# Patient Record
Sex: Male | Born: 1955 | Race: White | Hispanic: No | Marital: Married | Smoking: Former smoker
Health system: Southern US, Community
[De-identification: ages and names within clinical notes are randomized; demographics above are authoritative.]

## PROBLEM LIST (undated history)

## (undated) DIAGNOSIS — R7303 Prediabetes: Secondary | ICD-10-CM

## (undated) DIAGNOSIS — K529 Noninfective gastroenteritis and colitis, unspecified: Secondary | ICD-10-CM

## (undated) DIAGNOSIS — K219 Gastro-esophageal reflux disease without esophagitis: Secondary | ICD-10-CM

## (undated) DIAGNOSIS — T7840XA Allergy, unspecified, initial encounter: Secondary | ICD-10-CM

## (undated) DIAGNOSIS — E785 Hyperlipidemia, unspecified: Secondary | ICD-10-CM

## (undated) HISTORY — DX: Allergy, unspecified, initial encounter: T78.40XA

## (undated) HISTORY — DX: Prediabetes: R73.03

## (undated) HISTORY — DX: Noninfective gastroenteritis and colitis, unspecified: K52.9

## (undated) HISTORY — DX: Hyperlipidemia, unspecified: E78.5

## (undated) HISTORY — DX: Gastro-esophageal reflux disease without esophagitis: K21.9

## (undated) HISTORY — PX: VASECTOMY: SHX75

---

## 2012-12-04 ENCOUNTER — Ambulatory Visit (INDEPENDENT_AMBULATORY_CARE_PROVIDER_SITE_OTHER): Payer: BC Managed Care – PPO | Admitting: Family Medicine

## 2012-12-04 ENCOUNTER — Encounter: Payer: Self-pay | Admitting: Family Medicine

## 2012-12-04 VITALS — BP 130/78 | HR 80 | Temp 98.0°F | Resp 20 | Wt 224.5 lb

## 2012-12-04 DIAGNOSIS — H60393 Other infective otitis externa, bilateral: Secondary | ICD-10-CM

## 2012-12-04 DIAGNOSIS — H60399 Other infective otitis externa, unspecified ear: Secondary | ICD-10-CM | POA: Insufficient documentation

## 2012-12-04 MED ORDER — CIPROFLOXACIN HCL 500 MG PO TABS: 500.0000 mg | ORAL_TABLET | Freq: Two times a day (BID) | ORAL | Status: DC

## 2012-12-04 MED ORDER — CIPROFLOXACIN-DEXAMETHASONE 0.3-0.1 % OT SUSP: 4.0000 [drp] | Freq: Two times a day (BID) | OTIC | Status: DC

## 2012-12-04 NOTE — Assessment & Plan Note (Signed)
Recurrent episodes of OE, he does wear ear plugs on regular basis He has been treated with oral and otic antibiotics which does resolve symptoms but has returned multiple times I did attempt wet prep from the drainage no fungus seen Will start him on Oral cipro, he is already on ciprodex drops but I dont think much is getting into the left ear. Referral placed to  ENT, he will be seen this week. I think he may need a wick placed

## 2012-12-04 NOTE — Patient Instructions (Signed)
Start the oral antibiotics as directed Continue drops as directed ENT appt on Friday

## 2012-12-04 NOTE — Progress Notes (Signed)
  Subjective:    Patient ID: Victor Chandler, male    DOB: 03-03-1955, 57 y.o.   MRN: [id number]  HPI  Patient here with recurrent ear pain and bilateral drainage for the past week. He is history of recurrent otitis externa in both ears. He was treated 3 times over the past year. He does wear earplugs. He has not been swimming or any other body of water. He denies any cough shortness of breath sinus drainage. He denies any fever. His hearing is muffled some  Review of Systems - per above GEN- denies fatigue, fever, weight loss,weakness, recent illness HEENT- denies eye drainage, change in vision, nasal discharge, CVS- denies chest pain, palpitations RESP- denies SOB, cough, wheeze Neuro- denies headache, dizziness, syncope, seizure activity        Objective:   Physical Exam GEN- NAD, alert and oriented x3 HEENT- PERRL, EOMI, non injected sclera, pink conjunctiva, MMM, oropharynx clear- Right Canal erythematous dry discharge, swelling of canal, decreased light reflex TM, Left TM unable to visualize, swelling thick white/yellow discharge in canal, erythema, Tenderness with manipulation, TM clear bilat no effusion, nares clear Lymph- Shotty cervical LAD, left side Pulses- Radial 2+         Assessment & Plan:

## 2013-02-10 ENCOUNTER — Other Ambulatory Visit: Payer: Self-pay | Admitting: Family Medicine

## 2013-02-10 MED ORDER — MOMETASONE FUROATE 50 MCG/ACT NA SUSP: 2.0000 | Freq: Every day | NASAL | Status: DC

## 2013-02-10 NOTE — Telephone Encounter (Signed)
Rx Refilled  

## 2013-04-26 ENCOUNTER — Other Ambulatory Visit: Payer: Self-pay | Admitting: Family Medicine

## 2013-05-27 ENCOUNTER — Other Ambulatory Visit: Payer: BC Managed Care – PPO

## 2013-05-27 ENCOUNTER — Encounter: Payer: Self-pay | Admitting: Family Medicine

## 2013-05-27 ENCOUNTER — Ambulatory Visit (INDEPENDENT_AMBULATORY_CARE_PROVIDER_SITE_OTHER): Payer: BC Managed Care – PPO | Admitting: Family Medicine

## 2013-05-27 VITALS — BP 142/78 | HR 78 | Temp 98.0°F | Resp 14 | Ht 69.5 in | Wt 226.0 lb

## 2013-05-27 DIAGNOSIS — E785 Hyperlipidemia, unspecified: Secondary | ICD-10-CM

## 2013-05-27 DIAGNOSIS — M5430 Sciatica, unspecified side: Secondary | ICD-10-CM

## 2013-05-27 DIAGNOSIS — Z0000 Encounter for general adult medical examination without abnormal findings: Secondary | ICD-10-CM

## 2013-05-27 DIAGNOSIS — M5386 Other specified dorsopathies, lumbar region: Secondary | ICD-10-CM | POA: Insufficient documentation

## 2013-05-27 LAB — CBC WITH DIFFERENTIAL/PLATELET
Basophils Absolute: 0.1 10*3/uL (ref 0.0–0.1)
Basophils Relative: 1 % (ref 0–1)
Eosinophils Absolute: 0.4 10*3/uL (ref 0.0–0.7)
Eosinophils Relative: 6 % — ABNORMAL HIGH (ref 0–5)
HEMATOCRIT: 44.1 % (ref 39.0–52.0)
Hemoglobin: 15.1 g/dL (ref 13.0–17.0)
LYMPHS PCT: 26 % (ref 12–46)
Lymphs Abs: 1.8 10*3/uL (ref 0.7–4.0)
MCH: 30.6 pg (ref 26.0–34.0)
MCHC: 34.2 g/dL (ref 30.0–36.0)
MCV: 89.5 fL (ref 78.0–100.0)
MONO ABS: 0.7 10*3/uL (ref 0.1–1.0)
MONOS PCT: 10 % (ref 3–12)
Neutro Abs: 4 10*3/uL (ref 1.7–7.7)
Neutrophils Relative %: 57 % (ref 43–77)
Platelets: 219 10*3/uL (ref 150–400)
RBC: 4.93 MIL/uL (ref 4.22–5.81)
RDW: 13.2 % (ref 11.5–15.5)
WBC: 7.1 10*3/uL (ref 4.0–10.5)

## 2013-05-27 LAB — COMPLETE METABOLIC PANEL WITH GFR
ALBUMIN: 4.2 g/dL (ref 3.5–5.2)
ALT: 26 U/L (ref 0–53)
AST: 32 U/L (ref 0–37)
Alkaline Phosphatase: 64 U/L (ref 39–117)
BUN: 16 mg/dL (ref 6–23)
CALCIUM: 8.9 mg/dL (ref 8.4–10.5)
CHLORIDE: 102 meq/L (ref 96–112)
CO2: 25 mEq/L (ref 19–32)
CREATININE: 1.16 mg/dL (ref 0.50–1.35)
GFR, Est African American: 81 mL/min
GFR, Est Non African American: 70 mL/min
Glucose, Bld: 98 mg/dL (ref 70–99)
POTASSIUM: 4.4 meq/L (ref 3.5–5.3)
Sodium: 139 mEq/L (ref 135–145)
TOTAL PROTEIN: 7.1 g/dL (ref 6.0–8.3)
Total Bilirubin: 0.5 mg/dL (ref 0.2–1.2)

## 2013-05-27 LAB — LIPID PANEL
Cholesterol: 168 mg/dL (ref 0–200)
HDL: 38 mg/dL — ABNORMAL LOW (ref >39)
LDL Cholesterol: 99 mg/dL (ref 0–99)
Total CHOL/HDL Ratio: 4.4 Ratio
Triglycerides: 156 mg/dL — ABNORMAL HIGH (ref <150)
VLDL: 31 mg/dL (ref 0–40)

## 2013-05-27 LAB — TSH: TSH: 2.2 u[IU]/mL (ref 0.350–4.500)

## 2013-05-27 LAB — PSA: PSA: 2.14 ng/mL (ref <=4.00)

## 2013-05-27 MED ORDER — METHYLPREDNISOLONE (PAK) 4 MG PO TABS: ORAL_TABLET | ORAL | Status: DC

## 2013-05-27 MED ORDER — MELOXICAM 7.5 MG PO TABS: 7.5000 mg | ORAL_TABLET | Freq: Every day | ORAL | Status: DC

## 2013-05-27 NOTE — Assessment & Plan Note (Addendum)
This sounds like more sciatica he does have a known mild disc bulge no recent imaging. I see no red flags on exam. Will give him a Medrol Dosepak after he completes that he can use the Mobic as needed. I did offer meds for pain such as  tramadol for severe pain but he respectfully declined. The next step would be to repeat image his back

## 2013-05-27 NOTE — Progress Notes (Signed)
Patient ID: Victor Chandler, male   DOB: 08-Nov-1955, 58 y.o.   MRN: [id number]   Subjective:    Patient ID: Victor Chandler, male    DOB: 07/14/1955, 58 y.o.   MRN: [id number]  Patient presents for R hip pain  patient here with acute low back and right leg pain. He states that he has a history of bulging disc. Many years ago she suffered from sciatica. Over the past month he has had increasing episodes of sharp pain that runs down the back of his right leg starting from his hip region. Occasionally he has tingling in his right great toe. No change in bowel or bladder no weakness in his legs no recent injury. He's requesting anti-inflammatory. He is still able to work is normal.    Review Of Systems:  GEN- denies fatigue, fever, weight loss,weakness, recent illness GU- denies dysuria, hematuria, dribbling, incontinence MSK- + joint pain, muscle aches, injury Neuro- denies headache, dizziness, syncope, seizure activity       Objective:    BP 142/78  Pulse 78  Temp(Src) 98 F (36.7 C) (Oral)  Resp 14  Ht 5' 9.5" (1.765 m)  Wt 226 lb (102.513 kg)  BMI 32.91 kg/m2 GEN- NAD, alert and oriented x3 MSK- Spine NT, TTP over right sciatic region, neg SLR. FROM HIP bilat, non antalgic gait Neuro- normal tone LE, sensation in tact, monofilament in tact, DTR symmetric bilat  EXT- No edema Pulses- Radial, DP- 2+        Assessment & Plan:      Problem List Items Addressed This Visit   Sciatica associated with disorder of lumbar spine - Primary      Note: This dictation was prepared with Dragon dictation along with smaller phrase technology. Any transcriptional errors that result from this process are unintentional.

## 2013-05-27 NOTE — Patient Instructions (Signed)
Take the medrol dose pak first THen  You can use the meloxicam with food once a day If not better call and imaging will be done

## 2013-05-28 LAB — VITAMIN D 25 HYDROXY (VIT D DEFICIENCY, FRACTURES): Vit D, 25-Hydroxy: 21 ng/mL — ABNORMAL LOW (ref 30–89)

## 2013-06-06 ENCOUNTER — Encounter: Payer: Self-pay | Admitting: Family Medicine

## 2013-06-06 ENCOUNTER — Ambulatory Visit (INDEPENDENT_AMBULATORY_CARE_PROVIDER_SITE_OTHER): Payer: BC Managed Care – PPO | Admitting: Family Medicine

## 2013-06-06 VITALS — BP 140/86 | HR 78 | Temp 98.4°F | Resp 16 | Ht 69.5 in | Wt 224.0 lb

## 2013-06-06 DIAGNOSIS — Z0000 Encounter for general adult medical examination without abnormal findings: Secondary | ICD-10-CM

## 2013-06-06 NOTE — Progress Notes (Signed)
Subjective:    Patient ID: Victor Chandler, male    DOB: 1955-11-23, 58 y.o.   MRN: [id number]  HPI Patient continues to have right-sided low back pain with pain radiating into his posterior right hip consistent with sciatica. He also complains that his right great toe is numb at times. Otherwise he is doing well without complications. His colonoscopy is up to date. He is due for his prostate screen today. His tetanus vaccine is up to date. His most recent lab work is listed below: Lab on 05/27/2013  Component Date Value Ref Range Status  . Cholesterol 05/27/2013 168  0 - 200 mg/dL Final   Comment: ATP III Classification:                                < 200        mg/dL        Desirable                               200 - 239     mg/dL        Borderline High                               >= 240        mg/dL        High                             . Triglycerides 05/27/2013 156* <150 mg/dL Final  . HDL 05/27/2013 38* >39 mg/dL Final  . Total CHOL/HDL Ratio 05/27/2013 4.4   Final  . VLDL 05/27/2013 31  0 - 40 mg/dL Final  . LDL Cholesterol 05/27/2013 99  0 - 99 mg/dL Final   Comment:                            Total Cholesterol/HDL Ratio:CHD Risk                                                 Coronary Heart Disease Risk Table                                                                 Men       Women                                   1/2 Average Risk              3.4        3.3                                       Average Risk  5.0        4.4                                    2X Average Risk              9.6        7.1                                    3X Average Risk             23.4       11.0                          Use the calculated Patient Ratio above and the CHD Risk table                           to determine the patient's CHD Risk.                          ATP III Classification (LDL):                                < 100        mg/dL         Optimal                             100 - 129     mg/dL         Near or Above Optimal                               130 - 159     mg/dL         Borderline High                               160 - 189     mg/dL         High                                > 190        mg/dL         Very High                             . WBC 05/27/2013 7.1  4.0 - 10.5 K/uL Final  . RBC 05/27/2013 4.93  4.22 - 5.81 MIL/uL Final  . Hemoglobin 05/27/2013 15.1  13.0 - 17.0 g/dL Final  . HCT 05/27/2013 44.1  39.0 - 52.0 % Final  . MCV 05/27/2013 89.5  78.0 - 100.0 fL Final  . MCH 05/27/2013 30.6  26.0 - 34.0 pg Final  . MCHC 05/27/2013 34.2  30.0 - 36.0 g/dL Final  . RDW 05/27/2013 13.2  11.5 - 15.5 % Final  . Platelets 05/27/2013 219  150 - 400 K/uL Final  . Neutrophils Relative % 05/27/2013  57  43 - 77 % Final  . Neutro Abs 05/27/2013 4.0  1.7 - 7.7 K/uL Final  . Lymphocytes Relative 05/27/2013 26  12 - 46 % Final  . Lymphs Abs 05/27/2013 1.8  0.7 - 4.0 K/uL Final  . Monocytes Relative 05/27/2013 10  3 - 12 % Final  . Monocytes Absolute 05/27/2013 0.7  0.1 - 1.0 K/uL Final  . Eosinophils Relative 05/27/2013 6* 0 - 5 % Final  . Eosinophils Absolute 05/27/2013 0.4  0.0 - 0.7 K/uL Final  . Basophils Relative 05/27/2013 1  0 - 1 % Final  . Basophils Absolute 05/27/2013 0.1  0.0 - 0.1 K/uL Final  . Smear Review 05/27/2013 Criteria for review not met   Final  . TSH 05/27/2013 2.200  0.350 - 4.500 uIU/mL Final  . Vit D, 25-Hydroxy 05/27/2013 21* 30 - 89 ng/mL Final   Comment: This assay accurately quantifies Vitamin D, which is the sum of the                          25-Hydroxy forms of Vitamin D2 and D3.  Studies have shown that the                          optimum concentration of 25-Hydroxy Vitamin D is 30 ng/mL or higher.                           Concentrations of Vitamin D between 20 and 29 ng/mL are considered to                          be insufficient and concentrations less than 20 ng/mL are considered                           to be deficient for Vitamin D.  . PSA 05/27/2013 2.14  <=4.00 ng/mL Final   Comment: Test Methodology: ECLIA PSA (Electrochemiluminescence Immunoassay)                                                     For PSA values from 2.5-4.0, particularly in younger men <60 years                          old, the AUA and NCCN suggest testing for % Free PSA (3515) and                          evaluation of the rate of increase in PSA (PSA velocity).  . Sodium 05/27/2013 139  135 - 145 mEq/L Final  . Potassium 05/27/2013 4.4  3.5 - 5.3 mEq/L Final  . Chloride 05/27/2013 102  96 - 112 mEq/L Final  . CO2 05/27/2013 25  19 - 32 mEq/L Final  . Glucose, Bld 05/27/2013 98  70 - 99 mg/dL Final  . BUN 05/27/2013 16  6 - 23 mg/dL Final  . Creat 05/27/2013 1.16  0.50 - 1.35 mg/dL Final  . Total Bilirubin 05/27/2013 0.5  0.2 - 1.2 mg/dL Final  . Alkaline Phosphatase 05/27/2013 64  39 - 117 U/L Final  .  AST 05/27/2013 32  0 - 37 U/L Final  . ALT 05/27/2013 26  0 - 53 U/L Final  . Total Protein 05/27/2013 7.1  6.0 - 8.3 g/dL Final  . Albumin 05/27/2013 4.2  3.5 - 5.2 g/dL Final  . Calcium 05/27/2013 8.9  8.4 - 10.5 mg/dL Final  . GFR, Est African American 05/27/2013 81   Final  . GFR, Est Non African American 05/27/2013 70   Final   Comment:                            The estimated GFR is a calculation valid for adults (>=65 years old)                          that uses the CKD-EPI algorithm to adjust for age and sex. It is                            not to be used for children, pregnant women, hospitalized patients,                             patients on dialysis, or with rapidly changing kidney function.                          According to the NKDEP, eGFR >89 is normal, 60-89 shows mild                          impairment, 30-59 shows moderate impairment, 15-29 shows severe                          impairment and <15 is ESRD.                              Past Medical History  Diagnosis  Date  . Allergy   . GERD (gastroesophageal reflux disease)   . Hyperlipidemia    Current Outpatient Prescriptions on File Prior to Visit  Medication Sig Dispense Refill  . cetirizine (ZYRTEC) 10 MG tablet Take 10 mg by mouth daily.      . fish oil-omega-3 fatty acids 1000 MG capsule Take 2 g by mouth daily.      . meloxicam (MOBIC) 7.5 MG tablet Take 1 tablet (7.5 mg total) by mouth daily.  30 tablet  2  . mometasone (NASONEX) 50 MCG/ACT nasal spray Place 2 sprays into the nose daily.  17 g  11  . simvastatin (ZOCOR) 40 MG tablet TAKE 1 TABLET BY MOUTH AT BEDTIME  90 tablet  0  . tadalafil (CIALIS) 10 MG tablet Take 10 mg by mouth daily as needed for erectile dysfunction.       No current facility-administered medications on file prior to visit.   Allergies  Allergen Reactions  . Codeine   . Sulfa Antibiotics    History   Social History  . Marital Status: Married    Spouse Name: N/A    Number of Children: N/A  . Years of Education: N/A   Occupational History  . Not on file.   Social History Main Topics  . Smoking status: Former Research scientist (life sciences)  . Smokeless tobacco: Not on file  .  Alcohol Use: Not on file  . Drug Use: Not on file  . Sexual Activity: Not on file   Other Topics Concern  . Not on file   Social History Narrative  . No narrative on file   No family history on file.    Review of Systems  All other systems reviewed and are negative.      Objective:   Physical Exam  Vitals reviewed. Constitutional: He is oriented to person, place, and time. He appears well-developed and well-nourished. No distress.  HENT:  Head: Normocephalic and atraumatic.  Right Ear: External ear normal.  Left Ear: External ear normal.  Nose: Nose normal.  Mouth/Throat: Oropharynx is clear and moist. No oropharyngeal exudate.  Eyes: Conjunctivae and EOM are normal. Pupils are equal, round, and reactive to light. Right eye exhibits no discharge. Left eye exhibits no discharge. No  scleral icterus.  Neck: Normal range of motion. Neck supple. No JVD present. No tracheal deviation present. No thyromegaly present.  Cardiovascular: Normal rate, regular rhythm, normal heart sounds and intact distal pulses.  Exam reveals no gallop and no friction rub.   No murmur heard. Pulmonary/Chest: Effort normal and breath sounds normal. No stridor. No respiratory distress. He has no wheezes. He has no rales. He exhibits no tenderness.  Abdominal: Soft. Bowel sounds are normal. He exhibits no distension and no mass. There is no tenderness. There is no rebound and no guarding.  Genitourinary: Rectum normal, prostate normal and penis normal.  Musculoskeletal: Normal range of motion. He exhibits no edema and no tenderness.  Lymphadenopathy:    He has no cervical adenopathy.  Neurological: He is alert and oriented to person, place, and time. He has normal reflexes. He displays normal reflexes. No cranial nerve deficit. He exhibits normal muscle tone. Coordination normal.  Skin: Skin is warm. No rash noted. He is not diaphoretic. No erythema. No pallor.  Psychiatric: He has a normal mood and affect. His behavior is normal. Judgment and thought content normal.          Assessment & Plan:  1. Routine general medical examination at a health care facility Patient's physical exam is completely normal. To the patient to decrease Zocor to 20 mg a day. I would recheck fasting lipid panel in 6 months. His blood pressure is borderline. I recommended diet increasing aerobic exercise and weight loss. It is low back pain and right-sided sciatica persists, I would proceed with an MRI of the lumbar spine. Otherwise his preventative care is up to date. Recheck in 6 months or sooner if worse.

## 2013-12-17 ENCOUNTER — Encounter: Payer: Self-pay | Admitting: Family Medicine

## 2013-12-17 ENCOUNTER — Ambulatory Visit (INDEPENDENT_AMBULATORY_CARE_PROVIDER_SITE_OTHER): Payer: BC Managed Care – PPO | Admitting: Family Medicine

## 2013-12-17 VITALS — BP 128/84 | HR 68 | Temp 98.1°F | Resp 14 | Ht 69.5 in | Wt 229.0 lb

## 2013-12-17 DIAGNOSIS — H00036 Abscess of eyelid left eye, unspecified eyelid: Secondary | ICD-10-CM

## 2013-12-17 DIAGNOSIS — L03213 Periorbital cellulitis: Secondary | ICD-10-CM

## 2013-12-17 MED ORDER — CEPHALEXIN 500 MG PO CAPS: 500.0000 mg | ORAL_CAPSULE | Freq: Three times a day (TID) | ORAL | Status: DC

## 2013-12-17 MED ORDER — DOXYCYCLINE HYCLATE 100 MG PO TABS: 100.0000 mg | ORAL_TABLET | Freq: Two times a day (BID) | ORAL | Status: DC

## 2013-12-17 NOTE — Progress Notes (Signed)
   Subjective:    Patient ID: Victor Chandler, male    DOB: 1955-06-20, 58 y.o.   MRN: [id number]  HPI 2 days ago, the patient developed a pimple on the left side of his nasal bridge. He tried to rupture the temple using his fingers. Now the left nasal bridge, the left eyebrow, and the left upper and lower eyelids are extremely erythematous tender and swollen. Fortunately, he has no pain with extraocular movement. He has no blurred vision. Past Medical History  Diagnosis Date  . Allergy   . GERD (gastroesophageal reflux disease)   . Hyperlipidemia    No past surgical history on file. Current Outpatient Prescriptions on File Prior to Visit  Medication Sig Dispense Refill  . cetirizine (ZYRTEC) 10 MG tablet Take 10 mg by mouth daily.      . fish oil-omega-3 fatty acids 1000 MG capsule Take 2 g by mouth daily.      . mometasone (NASONEX) 50 MCG/ACT nasal spray Place 2 sprays into the nose daily.  17 g  11  . tadalafil (CIALIS) 10 MG tablet Take 10 mg by mouth daily as needed for erectile dysfunction.       No current facility-administered medications on file prior to visit.   Allergies  Allergen Reactions  . Codeine   . Sulfa Antibiotics    History   Social History  . Marital Status: Married    Spouse Name: N/A    Number of Children: N/A  . Years of Education: N/A   Occupational History  . Not on file.   Social History Main Topics  . Smoking status: Former Research scientist (life sciences)  . Smokeless tobacco: Not on file  . Alcohol Use: Not on file  . Drug Use: Not on file  . Sexual Activity: Not on file   Other Topics Concern  . Not on file   Social History Narrative  . No narrative on file      Review of Systems  All other systems reviewed and are negative.      Objective:   Physical Exam  Vitals reviewed. Cardiovascular: Normal rate and regular rhythm.   Pulmonary/Chest: Effort normal and breath sounds normal.  Skin: Skin is warm. There is erythema.   tissue around the left  side including the left eyebrow, the left upper and lower eyelid, and the left nasal bridge are erythematous tender and swollen. There is no pain with extraocular movement.        Assessment & Plan:  Preseptal cellulitis of left eye - Plan: doxycycline (VIBRA-TABS) 100 MG tablet, cephALEXin (KEFLEX) 500 MG capsule  Begin doxycycline 100 mg by mouth twice a day for 10 days and Keflex 500 mg by mouth 3 times a day for 7 days. Recheck in 48 hours or immediately if worse.

## 2015-03-27 ENCOUNTER — Other Ambulatory Visit: Payer: BLUE CROSS/BLUE SHIELD

## 2015-03-27 ENCOUNTER — Other Ambulatory Visit: Payer: Self-pay | Admitting: Family Medicine

## 2015-03-27 DIAGNOSIS — E781 Pure hyperglyceridemia: Secondary | ICD-10-CM

## 2015-03-27 DIAGNOSIS — Z125 Encounter for screening for malignant neoplasm of prostate: Secondary | ICD-10-CM

## 2015-03-27 DIAGNOSIS — Z79899 Other long term (current) drug therapy: Secondary | ICD-10-CM

## 2015-03-27 DIAGNOSIS — E559 Vitamin D deficiency, unspecified: Secondary | ICD-10-CM

## 2015-03-27 DIAGNOSIS — Z0000 Encounter for general adult medical examination without abnormal findings: Secondary | ICD-10-CM

## 2015-03-27 LAB — COMPLETE METABOLIC PANEL WITH GFR
ALK PHOS: 55 U/L (ref 40–115)
ALT: 22 U/L (ref 9–46)
AST: 29 U/L (ref 10–35)
Albumin: 4.2 g/dL (ref 3.6–5.1)
BILIRUBIN TOTAL: 0.9 mg/dL (ref 0.2–1.2)
BUN: 14 mg/dL (ref 7–25)
CO2: 25 mmol/L (ref 20–31)
CREATININE: 1.23 mg/dL (ref 0.70–1.33)
Calcium: 9 mg/dL (ref 8.6–10.3)
Chloride: 101 mmol/L (ref 98–110)
GFR, EST AFRICAN AMERICAN: 75 mL/min (ref >=60)
GFR, Est Non African American: 65 mL/min (ref >=60)
GLUCOSE: 96 mg/dL (ref 70–99)
Potassium: 4.1 mmol/L (ref 3.5–5.3)
SODIUM: 137 mmol/L (ref 135–146)
TOTAL PROTEIN: 7.2 g/dL (ref 6.1–8.1)

## 2015-03-27 LAB — CBC WITH DIFFERENTIAL/PLATELET
BASOS PCT: 0 % (ref 0–1)
Basophils Absolute: 0 10*3/uL (ref 0.0–0.1)
Eosinophils Absolute: 0.2 10*3/uL (ref 0.0–0.7)
Eosinophils Relative: 4 % (ref 0–5)
HCT: 47.3 % (ref 39.0–52.0)
HEMOGLOBIN: 15.8 g/dL (ref 13.0–17.0)
Lymphocytes Relative: 24 % (ref 12–46)
Lymphs Abs: 1.5 10*3/uL (ref 0.7–4.0)
MCH: 30.7 pg (ref 26.0–34.0)
MCHC: 33.4 g/dL (ref 30.0–36.0)
MCV: 91.8 fL (ref 78.0–100.0)
MONO ABS: 0.5 10*3/uL (ref 0.1–1.0)
MPV: 11.6 fL (ref 8.6–12.4)
Monocytes Relative: 8 % (ref 3–12)
NEUTROS ABS: 4 10*3/uL (ref 1.7–7.7)
NEUTROS PCT: 64 % (ref 43–77)
Platelets: 255 10*3/uL (ref 150–400)
RBC: 5.15 MIL/uL (ref 4.22–5.81)
RDW: 13.4 % (ref 11.5–15.5)
WBC: 6.2 10*3/uL (ref 4.0–10.5)

## 2015-03-27 LAB — LIPID PANEL
Cholesterol: 200 mg/dL (ref 125–200)
HDL: 37 mg/dL — ABNORMAL LOW (ref >=40)
LDL CALC: 140 mg/dL — AB (ref <130)
Total CHOL/HDL Ratio: 5.4 Ratio — ABNORMAL HIGH (ref <=5.0)
Triglycerides: 115 mg/dL (ref <150)
VLDL: 23 mg/dL (ref <30)

## 2015-03-27 LAB — TSH: TSH: 1.879 u[IU]/mL (ref 0.350–4.500)

## 2015-03-28 LAB — PSA: PSA: 1.82 ng/mL (ref <=4.00)

## 2015-03-29 LAB — VITAMIN D 25 HYDROXY (VIT D DEFICIENCY, FRACTURES): VIT D 25 HYDROXY: 16 ng/mL — AB (ref 30–100)

## 2015-03-30 ENCOUNTER — Encounter: Payer: Self-pay | Admitting: Family Medicine

## 2015-03-30 ENCOUNTER — Ambulatory Visit (INDEPENDENT_AMBULATORY_CARE_PROVIDER_SITE_OTHER): Payer: BLUE CROSS/BLUE SHIELD | Admitting: Family Medicine

## 2015-03-30 VITALS — BP 122/84 | HR 78 | Temp 98.5°F | Resp 18 | Ht 69.5 in | Wt 222.0 lb

## 2015-03-30 DIAGNOSIS — Z0000 Encounter for general adult medical examination without abnormal findings: Secondary | ICD-10-CM | POA: Diagnosis not present

## 2015-03-30 DIAGNOSIS — R5382 Chronic fatigue, unspecified: Secondary | ICD-10-CM | POA: Diagnosis not present

## 2015-03-30 NOTE — Progress Notes (Signed)
Subjective:    Patient ID: Victor Chandler, male    DOB: 1955-04-28, 60 y.o.   MRN: [id number]  HPI  He is here today for complete physical exam. He reports erectile dysfunction that does not respond to cialis.  I did give him a bad headache. He denies any change in his libido or muscle mass. He does complain of fatigueOtherwise he is doing well without complications. His colonoscopy is up to date. He is due for his prostate screen today. His tetanus vaccine is up to date. His most recent lab work is listed below: Appointment on 03/27/2015  Component Date Value Ref Range Status  . Sodium 03/27/2015 137  135 - 146 mmol/L Final  . Potassium 03/27/2015 4.1  3.5 - 5.3 mmol/L Final  . Chloride 03/27/2015 101  98 - 110 mmol/L Final  . CO2 03/27/2015 25  20 - 31 mmol/L Final  . Glucose, Bld 03/27/2015 96  70 - 99 mg/dL Final  . BUN 03/27/2015 14  7 - 25 mg/dL Final  . Creat 03/27/2015 1.23  0.70 - 1.33 mg/dL Final  . Total Bilirubin 03/27/2015 0.9  0.2 - 1.2 mg/dL Final  . Alkaline Phosphatase 03/27/2015 55  40 - 115 U/L Final  . AST 03/27/2015 29  10 - 35 U/L Final  . ALT 03/27/2015 22  9 - 46 U/L Final  . Total Protein 03/27/2015 7.2  6.1 - 8.1 g/dL Final  . Albumin 03/27/2015 4.2  3.6 - 5.1 g/dL Final  . Calcium 03/27/2015 9.0  8.6 - 10.3 mg/dL Final  . GFR, Est African American 03/27/2015 75  >=60 mL/min Final  . GFR, Est Non African American 03/27/2015 65  >=60 mL/min Final   Comment:   The estimated GFR is a calculation valid for adults (>=61 years old) that uses the CKD-EPI algorithm to adjust for age and sex. It is   not to be used for children, pregnant women, hospitalized patients,    patients on dialysis, or with rapidly changing kidney function. According to the NKDEP, eGFR >89 is normal, 60-89 shows mild impairment, 30-59 shows moderate impairment, 15-29 shows severe impairment and <15 is ESRD.     . TSH 03/27/2015 1.879  0.350 - 4.500 uIU/mL Final  . Cholesterol  03/27/2015 200  125 - 200 mg/dL Final  . Triglycerides 03/27/2015 115  <150 mg/dL Final  . HDL 03/27/2015 37* >=40 mg/dL Final  . Total CHOL/HDL Ratio 03/27/2015 5.4* <=5.0 Ratio Final  . VLDL 03/27/2015 23  <30 mg/dL Final  . LDL Cholesterol 03/27/2015 140* <130 mg/dL Final   Comment:   Total Cholesterol/HDL Ratio:CHD Risk                        Coronary Heart Disease Risk Table                                        Men       Women          1/2 Average Risk              3.4        3.3              Average Risk              5.0        4.4  2X Average Risk              9.6        7.1           3X Average Risk             23.4       11.0 Use the calculated Patient Ratio above and the CHD Risk table  to determine the patient's CHD Risk.   . WBC 03/27/2015 6.2  4.0 - 10.5 K/uL Final  . RBC 03/27/2015 5.15  4.22 - 5.81 MIL/uL Final  . Hemoglobin 03/27/2015 15.8  13.0 - 17.0 g/dL Final  . HCT 03/27/2015 47.3  39.0 - 52.0 % Final  . MCV 03/27/2015 91.8  78.0 - 100.0 fL Final  . MCH 03/27/2015 30.7  26.0 - 34.0 pg Final  . MCHC 03/27/2015 33.4  30.0 - 36.0 g/dL Final  . RDW 03/27/2015 13.4  11.5 - 15.5 % Final  . Platelets 03/27/2015 255  150 - 400 K/uL Final  . MPV 03/27/2015 11.6  8.6 - 12.4 fL Final  . Neutrophils Relative % 03/27/2015 64  43 - 77 % Final  . Neutro Abs 03/27/2015 4.0  1.7 - 7.7 K/uL Final  . Lymphocytes Relative 03/27/2015 24  12 - 46 % Final  . Lymphs Abs 03/27/2015 1.5  0.7 - 4.0 K/uL Final  . Monocytes Relative 03/27/2015 8  3 - 12 % Final  . Monocytes Absolute 03/27/2015 0.5  0.1 - 1.0 K/uL Final  . Eosinophils Relative 03/27/2015 4  0 - 5 % Final  . Eosinophils Absolute 03/27/2015 0.2  0.0 - 0.7 K/uL Final  . Basophils Relative 03/27/2015 0  0 - 1 % Final  . Basophils Absolute 03/27/2015 0.0  0.0 - 0.1 K/uL Final  . Smear Review 03/27/2015 Criteria for review not met   Final  . Vit D, 25-Hydroxy 03/27/2015 16* 30 - 100 ng/mL Final   Comment: Vitamin D  Status           25-OH Vitamin D        Deficiency                <20 ng/mL        Insufficiency         20 - 29 ng/mL        Optimal             > or = 30 ng/mL   For 25-OH Vitamin D testing on patients on D2-supplementation and patients for whom quantitation of D2 and D3 fractions is required, the QuestAssureD 25-OH VIT D, (D2,D3), LC/MS/MS is recommended: order code [id number] (patients > 2 yrs).   . PSA 03/27/2015 1.82  <=4.00 ng/mL Final   Comment: Test Methodology: ECLIA PSA (Electrochemiluminescence Immunoassay)   For PSA values from 2.5-4.0, particularly in younger men <63 years old, the AUA and NCCN suggest testing for % Free PSA (3515) and evaluation of the rate of increase in PSA (PSA velocity).    Past Medical History  Diagnosis Date  . Allergy   . GERD (gastroesophageal reflux disease)   . Hyperlipidemia    Current Outpatient Prescriptions on File Prior to Visit  Medication Sig Dispense Refill  . cetirizine (ZYRTEC) 10 MG tablet Take 10 mg by mouth daily.    . fish oil-omega-3 fatty acids 1000 MG capsule Take 2 g by mouth daily.    . mometasone (NASONEX) 50 MCG/ACT nasal spray Place 2 sprays into  the nose daily. 17 g 11  . tadalafil (CIALIS) 10 MG tablet Take 10 mg by mouth daily as needed for erectile dysfunction.     No current facility-administered medications on file prior to visit.   Allergies  Allergen Reactions  . Codeine   . Sulfa Antibiotics    Social History   Social History  . Marital Status: Married    Spouse Name: N/A  . Number of Children: N/A  . Years of Education: N/A   Occupational History  . Not on file.   Social History Main Topics  . Smoking status: Former Research scientist (life sciences)  . Smokeless tobacco: Not on file  . Alcohol Use: Not on file  . Drug Use: Not on file  . Sexual Activity: Not on file   Other Topics Concern  . Not on file   Social History Narrative   No family history on file.    Review of Systems  All other systems reviewed and  are negative.      Objective:   Physical Exam  Constitutional: He is oriented to person, place, and time. He appears well-developed and well-nourished. No distress.  HENT:  Head: Normocephalic and atraumatic.  Right Ear: External ear normal.  Left Ear: External ear normal.  Nose: Nose normal.  Mouth/Throat: Oropharynx is clear and moist. No oropharyngeal exudate.  Eyes: Conjunctivae and EOM are normal. Pupils are equal, round, and reactive to light. Right eye exhibits no discharge. Left eye exhibits no discharge. No scleral icterus.  Neck: Normal range of motion. Neck supple. No JVD present. No tracheal deviation present. No thyromegaly present.  Cardiovascular: Normal rate, regular rhythm, normal heart sounds and intact distal pulses.  Exam reveals no gallop and no friction rub.   No murmur heard. Pulmonary/Chest: Effort normal and breath sounds normal. No stridor. No respiratory distress. He has no wheezes. He has no rales. He exhibits no tenderness.  Abdominal: Soft. Bowel sounds are normal. He exhibits no distension and no mass. There is no tenderness. There is no rebound and no guarding.  Genitourinary: Rectum normal, prostate normal and penis normal.  Musculoskeletal: Normal range of motion. He exhibits no edema or tenderness.  Lymphadenopathy:    He has no cervical adenopathy.  Neurological: He is alert and oriented to person, place, and time. He has normal reflexes. No cranial nerve deficit. He exhibits normal muscle tone. Coordination normal.  Skin: Skin is warm. No rash noted. He is not diaphoretic. No erythema. No pallor.  Psychiatric: He has a normal mood and affect. His behavior is normal. Judgment and thought content normal.  Vitals reviewed.         Assessment & Plan:  1. Routine general medical examination at a health care facility Patient's physical exam is completely normal. Preventative care is up-to-date. PSA is normal. I believe his erectile dysfunction is  likely vascular in nature. Therefore I would like the patient to try levitra milligrams by mouth daily when necessary. I will also check a testosterone level. His cholesterol has gone up. However thinks that the patient no longer smokes. Therefore his goal LDL cholesterol is less than 130. I recommended diet exercise and weight loss and recheck his cholesterol in 6 months. I recommended a low saturated fat diet and a diet high in fruits and vegetables.

## 2015-03-31 LAB — TESTOSTERONE: TESTOSTERONE: 509 ng/dL (ref 250–827)

## 2015-04-01 ENCOUNTER — Encounter: Payer: Self-pay | Admitting: *Deleted

## 2016-09-27 ENCOUNTER — Ambulatory Visit (INDEPENDENT_AMBULATORY_CARE_PROVIDER_SITE_OTHER): Payer: BLUE CROSS/BLUE SHIELD | Admitting: Family Medicine

## 2016-09-27 ENCOUNTER — Encounter: Payer: Self-pay | Admitting: Family Medicine

## 2016-09-27 VITALS — BP 136/90 | HR 82 | Temp 98.5°F | Resp 18 | Ht 69.5 in | Wt 232.0 lb

## 2016-09-27 DIAGNOSIS — M5431 Sciatica, right side: Secondary | ICD-10-CM | POA: Diagnosis not present

## 2016-09-27 MED ORDER — PREDNISONE 20 MG PO TABS
ORAL_TABLET | ORAL | 0 refills | Status: DC
Start: 2016-09-27 — End: 2017-07-13

## 2016-09-27 NOTE — Progress Notes (Signed)
Subjective:    Patient ID: Victor Chandler, male    DOB: 05/07/1955, 61 y.o.   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 This was diagnosed on MRI more than a decade ago. Pain gradually improved from that. Recently he has suffered several injuries while at work. He apparently fell through a roof while working on a home landing on his back. He is also struck by tree that was falling that he was cutting down.  He now reports pain in his lower back around the level of L5 radiating into his right posterior gluteus down his right posterior thigh into his right ankle and foot. He reports numbness and tingling and searing pain. Pain is made worse by standing or walking for long periods of time. There is no pain in his hip. He has full range of motion in the hip and in the knee without pain.  Pain has been ongoing now for more than 2 months. He originally thought it would just go away but is only steadily getting worse. Past Medical History:  Diagnosis Date  . Allergy   . GERD (gastroesophageal reflux disease)   . Hyperlipidemia    No past surgical history on file. Current Outpatient Prescriptions on File Prior to Visit  Medication Sig Dispense Refill  . cetirizine (ZYRTEC) 10 MG tablet Take 10 mg by mouth daily.    . fish oil-omega-3 fatty acids 1000 MG capsule Take 2 g by mouth daily.    . mometasone (NASONEX) 50 MCG/ACT nasal spray Place 2 sprays into the nose daily. 17 g 11  . tadalafil (CIALIS) 10 MG tablet Take 10 mg by mouth daily as needed for erectile dysfunction.     No current facility-administered medications on file prior to visit.    Allergies  Allergen Reactions  . Codeine   . Sulfa Antibiotics    Social History   Social History  . Marital status: Married    Spouse name: N/A  . Number of children: N/A  . Years of education: N/A   Occupational History  . Not on file.    Social History Main Topics  . Smoking status: Former Research scientist (life sciences)  . Smokeless tobacco: Former Systems developer  . Alcohol use Not on file  . Drug use: Unknown  . Sexual activity: Not on file   Other Topics Concern  . Not on file   Social History Narrative  . No narrative on file      Review of Systems  All other systems reviewed and are negative.      Objective:   Physical Exam  Cardiovascular: Normal rate, regular rhythm and normal heart sounds.   Pulmonary/Chest: Effort normal and breath sounds normal. No respiratory distress. He has no wheezes. He has no rales.  Musculoskeletal:       Lumbar back: He exhibits decreased range of motion and pain. He exhibits no tenderness, no bony tenderness and no spasm.       Back:  Vitals reviewed.         Assessment & Plan:  Right sided sciatica - Plan: predniSONE (DELTASONE) 20 MG tablet, DG Lumbar Spine Complete  I suspect that the patient has reaggravated his herniated disc is now experiencing sciatica secondary to this. Received an x-ray of the lower back given the recent traumatic injuries the patient has suffered to rule out any occult fracture. If normal, begin prednisone taper  pack and reassess in 2 weeks

## 2016-09-28 ENCOUNTER — Ambulatory Visit
Admission: RE | Admit: 2016-09-28 | Discharge: 2016-09-28 | Disposition: A | Discharge status: Home / Self Care | Payer: BLUE CROSS/BLUE SHIELD | Source: Ambulatory Visit | Attending: Family Medicine | Admitting: Family Medicine

## 2016-09-28 DIAGNOSIS — M5431 Sciatica, right side: Secondary | ICD-10-CM

## 2016-10-18 ENCOUNTER — Telehealth: Payer: Self-pay | Admitting: Family Medicine

## 2016-10-18 DIAGNOSIS — M5441 Lumbago with sciatica, right side: Principal | ICD-10-CM

## 2016-10-18 DIAGNOSIS — G8929 Other chronic pain: Secondary | ICD-10-CM

## 2016-10-18 NOTE — Telephone Encounter (Signed)
Pt states that he finished the medication and his back pain is no better and wants to know what is the next step?

## 2016-10-19 NOTE — Telephone Encounter (Signed)
Proceed with MRI of Lumbar spine w/o contrast to look for areas amenable to epidural steroid injections.

## 2016-10-19 NOTE — Telephone Encounter (Signed)
MRI ordered and pt aware via vm

## 2016-11-03 ENCOUNTER — Telehealth: Payer: Self-pay | Admitting: *Deleted

## 2016-11-03 DIAGNOSIS — M5136 Other intervertebral disc degeneration, lumbar region: Secondary | ICD-10-CM

## 2016-11-03 DIAGNOSIS — M5416 Radiculopathy, lumbar region: Secondary | ICD-10-CM

## 2016-11-03 DIAGNOSIS — M5126 Other intervertebral disc displacement, lumbar region: Secondary | ICD-10-CM

## 2016-11-03 NOTE — Telephone Encounter (Signed)
Patient returned call and made aware.   Agreed with referral.

## 2016-11-03 NOTE — Telephone Encounter (Signed)
Received MRI Lumbar Spine results from [facility]   Noted impression: 1. Disc protrusion at L4-5 eccentric to the right with compression of the traversing right L5 root at the lateral recess. There is also mild right L4-5 foraminal stenosis.  2. Mild right lateral recess and foraminal stenosis at L3-4. 3. Background mild congenital canal stenosis.   MD reviewed and recommendations are as follows: Bulging disc at L4-5 pinching right L5 nerve root. Consult neurosurgery.   Call placed to patient. Victor Chandler.   Referral orders placed.

## 2016-11-10 ENCOUNTER — Telehealth: Payer: Self-pay | Admitting: *Deleted

## 2016-11-10 NOTE — Telephone Encounter (Signed)
Patient calling to inquire as to status of referral.   Please contact patient at [phone]~ telephone.

## 2016-11-11 NOTE — Telephone Encounter (Signed)
Left pt message that referral has been sent and is in review.  Can be a 2-3 week process.

## 2017-02-28 HISTORY — PX: LUMBAR LAMINECTOMY: SHX95

## 2017-06-28 ENCOUNTER — Other Ambulatory Visit: Payer: Self-pay | Admitting: Family Medicine

## 2017-06-28 DIAGNOSIS — Z0000 Encounter for general adult medical examination without abnormal findings: Secondary | ICD-10-CM

## 2017-06-28 DIAGNOSIS — Z125 Encounter for screening for malignant neoplasm of prostate: Secondary | ICD-10-CM

## 2017-07-06 ENCOUNTER — Other Ambulatory Visit: Payer: BLUE CROSS/BLUE SHIELD

## 2017-07-06 DIAGNOSIS — Z0000 Encounter for general adult medical examination without abnormal findings: Secondary | ICD-10-CM

## 2017-07-06 DIAGNOSIS — Z125 Encounter for screening for malignant neoplasm of prostate: Secondary | ICD-10-CM

## 2017-07-06 LAB — LIPID PANEL
Cholesterol: 215 mg/dL — ABNORMAL HIGH (ref <200)
HDL: 40 mg/dL — ABNORMAL LOW (ref >40)
LDL Cholesterol (Calc): 147 mg/dL (calc) — ABNORMAL HIGH
Non-HDL Cholesterol (Calc): 175 mg/dL (calc) — ABNORMAL HIGH (ref <130)
TRIGLYCERIDES: 146 mg/dL (ref <150)
Total CHOL/HDL Ratio: 5.4 (calc) — ABNORMAL HIGH (ref <5.0)

## 2017-07-06 LAB — COMPREHENSIVE METABOLIC PANEL
AG RATIO: 1.4 (calc) (ref 1.0–2.5)
ALT: 26 U/L (ref 9–46)
AST: 31 U/L (ref 10–35)
Albumin: 4.3 g/dL (ref 3.6–5.1)
Alkaline phosphatase (APISO): 54 U/L (ref 40–115)
BILIRUBIN TOTAL: 0.7 mg/dL (ref 0.2–1.2)
BUN: 16 mg/dL (ref 7–25)
CHLORIDE: 103 mmol/L (ref 98–110)
CO2: 29 mmol/L (ref 20–32)
Calcium: 9.4 mg/dL (ref 8.6–10.3)
Creat: 1.17 mg/dL (ref 0.70–1.33)
Globulin: 3 g/dL (calc) (ref 1.9–3.7)
Glucose, Bld: 100 mg/dL — ABNORMAL HIGH (ref 65–99)
Potassium: 4.7 mmol/L (ref 3.5–5.3)
SODIUM: 138 mmol/L (ref 135–146)
TOTAL PROTEIN: 7.3 g/dL (ref 6.1–8.1)

## 2017-07-06 LAB — CBC WITH DIFFERENTIAL/PLATELET
BASOS ABS: 61 {cells}/uL (ref 0–200)
Basophils Relative: 0.9 %
EOS PCT: 5.3 %
Eosinophils Absolute: 360 cells/uL (ref 15–500)
HEMATOCRIT: 46.7 % (ref 38.5–50.0)
HEMOGLOBIN: 16.2 g/dL (ref 13.2–17.1)
Lymphs Abs: 1965 cells/uL (ref 850–3900)
MCH: 31.2 pg (ref 27.0–33.0)
MCHC: 34.7 g/dL (ref 32.0–36.0)
MCV: 89.8 fL (ref 80.0–100.0)
MPV: 11.9 fL (ref 7.5–12.5)
Monocytes Relative: 10.4 %
Neutro Abs: 3706 cells/uL (ref 1500–7800)
Neutrophils Relative %: 54.5 %
Platelets: 246 10*3/uL (ref 140–400)
RBC: 5.2 10*6/uL (ref 4.20–5.80)
RDW: 12.4 % (ref 11.0–15.0)
Total Lymphocyte: 28.9 %
WBC mixed population: 707 cells/uL (ref 200–950)
WBC: 6.8 10*3/uL (ref 3.8–10.8)

## 2017-07-06 LAB — PSA: PSA: 1.9 ng/mL (ref <=4.0)

## 2017-07-13 ENCOUNTER — Encounter: Payer: Self-pay | Admitting: Family Medicine

## 2017-07-13 ENCOUNTER — Ambulatory Visit (INDEPENDENT_AMBULATORY_CARE_PROVIDER_SITE_OTHER): Payer: BLUE CROSS/BLUE SHIELD | Admitting: Family Medicine

## 2017-07-13 VITALS — BP 122/78 | HR 82 | Temp 98.5°F | Resp 16 | Ht 69.5 in | Wt 230.0 lb

## 2017-07-13 DIAGNOSIS — Z125 Encounter for screening for malignant neoplasm of prostate: Secondary | ICD-10-CM

## 2017-07-13 DIAGNOSIS — Z0000 Encounter for general adult medical examination without abnormal findings: Secondary | ICD-10-CM

## 2017-07-13 MED ORDER — ATORVASTATIN CALCIUM 10 MG PO TABS: 10.0000 mg | ORAL_TABLET | Freq: Every day | ORAL | 3 refills | Status: DC

## 2017-07-13 NOTE — Progress Notes (Signed)
Subjective:    Patient ID: Victor Chandler, male    DOB: [birth year], 62 y.o.   MRN: [id number]  HPI Patient is here today for complete physical exam.  Since I last saw the patient, he had low back surgery which resolved his sciatica.  He is done quite well postoperatively and states that he is now pain-free.  He is exercising every day doing a combination of weightlifting and cardio.  He is also monitoring his diet.  He denies any medical concerns today aside from skin tags.  In his left axilla there are probably 15-16 skin tags.  He is asked me to remove several today.  His colonoscopy is up to date. He is due for his prostate screen today. His tetanus vaccine is up to date. His most recent lab work is listed below: Appointment on 07/06/2017  Component Date Value Ref Range Status  . WBC 07/06/2017 6.8  3.8 - 10.8 Thousand/uL Final  . RBC 07/06/2017 5.20  4.20 - 5.80 Million/uL Final  . Hemoglobin 07/06/2017 16.2  13.2 - 17.1 g/dL Final  . HCT 07/06/2017 46.7  38.5 - 50.0 % Final  . MCV 07/06/2017 89.8  80.0 - 100.0 fL Final  . MCH 07/06/2017 31.2  27.0 - 33.0 pg Final  . MCHC 07/06/2017 34.7  32.0 - 36.0 g/dL Final  . RDW 07/06/2017 12.4  11.0 - 15.0 % Final  . Platelets 07/06/2017 246  140 - 400 Thousand/uL Final  . MPV 07/06/2017 11.9  7.5 - 12.5 fL Final  . Neutro Abs 07/06/2017 3,706  1,500 - 7,800 cells/uL Final  . Lymphs Abs 07/06/2017 1,965  850 - 3,900 cells/uL Final  . WBC mixed population 07/06/2017 707  200 - 950 cells/uL Final  . Eosinophils Absolute 07/06/2017 360  15 - 500 cells/uL Final  . Basophils Absolute 07/06/2017 61  0 - 200 cells/uL Final  . Neutrophils Relative % 07/06/2017 54.5  % Final  . Total Lymphocyte 07/06/2017 28.9  % Final  . Monocytes Relative 07/06/2017 10.4  % Final  . Eosinophils Relative 07/06/2017 5.3  % Final  . Basophils Relative 07/06/2017 0.9  % Final  . Glucose, Bld 07/06/2017 100* 65 - 99 mg/dL Final   Comment: .            Fasting  reference interval . For someone without known diabetes, a glucose value between 100 and 125 mg/dL is consistent with prediabetes and should be confirmed with a follow-up test. .   . BUN 07/06/2017 16  7 - 25 mg/dL Final  . Creat 07/06/2017 1.17  0.70 - 1.33 mg/dL Final   Comment: For patients >20 years of age, the reference limit for Creatinine is approximately 13% higher for people identified as African-American. .   Havery Moros Ratio 09/98/3382 NOT APPLICABLE  6 - 22 (calc) Final  . Sodium 07/06/2017 138  135 - 146 mmol/L Final  . Potassium 07/06/2017 4.7  3.5 - 5.3 mmol/L Final  . Chloride 07/06/2017 103  98 - 110 mmol/L Final  . CO2 07/06/2017 29  20 - 32 mmol/L Final  . Calcium 07/06/2017 9.4  8.6 - 10.3 mg/dL Final  . Total Protein 07/06/2017 7.3  6.1 - 8.1 g/dL Final  . Albumin 07/06/2017 4.3  3.6 - 5.1 g/dL Final  . Globulin 07/06/2017 3.0  1.9 - 3.7 g/dL (calc) Final  . AG Ratio 07/06/2017 1.4  1.0 - 2.5 (calc) Final  . Total Bilirubin 07/06/2017 0.7  0.2 - 1.2  mg/dL Final  . Alkaline phosphatase (APISO) 07/06/2017 54  40 - 115 U/L Final  . AST 07/06/2017 31  10 - 35 U/L Final  . ALT 07/06/2017 26  9 - 46 U/L Final  . Cholesterol 07/06/2017 215* <200 mg/dL Final  . HDL 07/06/2017 40* >40 mg/dL Final  . Triglycerides 07/06/2017 146  <150 mg/dL Final  . LDL Cholesterol (Calc) 07/06/2017 147* mg/dL (calc) Final   Comment: Reference range: <100 . Desirable range <100 mg/dL for primary prevention;   <70 mg/dL for patients with CHD or diabetic patients  with > or = 2 CHD risk factors. Marland Kitchen LDL-C is now calculated using the Martin-Hopkins  calculation, which is a validated novel method providing  better accuracy than the Friedewald equation in the  estimation of LDL-C.  Cresenciano Genre et al. Annamaria Helling. 7829;562(13): 2061-2068  (http://education.[facility].com/faq/FAQ164)   . Total CHOL/HDL Ratio 07/06/2017 5.4* <5.0 (calc) Final  . Non-HDL Cholesterol (Calc) 07/06/2017  175* <130 mg/dL (calc) Final   Comment: For patients with diabetes plus 1 major ASCVD risk  factor, treating to a non-HDL-C goal of <100 mg/dL  (LDL-C of <70 mg/dL) is considered a therapeutic  option.   Marland Kitchen PSA 07/06/2017 1.9  < OR = 4.0 ng/mL Final   Comment: The total PSA value from this assay system is  standardized against the WHO standard. The test  result will be approximately 20% lower when compared  to the equimolar-standardized total PSA (Beckman  Coulter). Comparison of serial PSA results should be  interpreted with this fact in mind. . This test was performed using the Siemens  chemiluminescent method. Values obtained from  different assay methods cannot be used interchangeably. PSA levels, regardless of value, should not be interpreted as absolute evidence of the presence or absence of disease.    Past Medical History:  Diagnosis Date  . Allergy   . GERD (gastroesophageal reflux disease)   . Hyperlipidemia    Current Outpatient Medications on File Prior to Visit  Medication Sig Dispense Refill  . cetirizine (ZYRTEC) 10 MG tablet Take 10 mg by mouth daily.    . cholecalciferol (VITAMIN D) 1000 units tablet Take 1,000 Units by mouth daily.    . fish oil-omega-3 fatty acids 1000 MG capsule Take 2 g by mouth daily.    . fluticasone (FLONASE) 50 MCG/ACT nasal spray Place 2 sprays into both nostrils daily.    Marland Kitchen glucosamine-chondroitin 500-400 MG tablet Take 1 tablet by mouth 3 (three) times daily.    . tadalafil (CIALIS) 10 MG tablet Take 10 mg by mouth daily as needed for erectile dysfunction.    . vitamin B-12 (CYANOCOBALAMIN) 500 MCG tablet Take 500 mcg by mouth daily.     No current facility-administered medications on file prior to visit.    Allergies  Allergen Reactions  . Codeine   . Sulfa Antibiotics    Social History   Socioeconomic History  . Marital status: Married    Spouse name: Not on file  . Number of children: Not on file  . Years of education:  Not on file  . Highest education level: Not on file  Occupational History  . Not on file  Social Needs  . Financial resource strain: Not on file  . Food insecurity:    Worry: Not on file    Inability: Not on file  . Transportation needs:    Medical: Not on file    Non-medical: Not on file  Tobacco Use  . Smoking status:  Former Smoker  . Smokeless tobacco: Former Network engineer and Sexual Activity  . Alcohol use: Not on file  . Drug use: Not on file  . Sexual activity: Not on file  Lifestyle  . Physical activity:    Days per week: Not on file    Minutes per session: Not on file  . Stress: Not on file  Relationships  . Social connections:    Talks on phone: Not on file    Gets together: Not on file    Attends religious service: Not on file    Active member of club or organization: Not on file    Attends meetings of clubs or organizations: Not on file    Relationship status: Not on file  . Intimate partner violence:    Fear of current or ex partner: Not on file    Emotionally abused: Not on file    Physically abused: Not on file    Forced sexual activity: Not on file  Other Topics Concern  . Not on file  Social History Narrative  . Not on file   No family history on file.    Review of Systems  All other systems reviewed and are negative.      Objective:   Physical Exam  Constitutional: He is oriented to person, place, and time. He appears well-developed and well-nourished. No distress.  HENT:  Head: Normocephalic and atraumatic.  Right Ear: External ear normal.  Left Ear: External ear normal.  Nose: Nose normal.  Mouth/Throat: Oropharynx is clear and moist. No oropharyngeal exudate.  Eyes: Pupils are equal, round, and reactive to light. Conjunctivae and EOM are normal. Right eye exhibits no discharge. Left eye exhibits no discharge. No scleral icterus.  Neck: Normal range of motion. Neck supple. No JVD present. No tracheal deviation present. No thyromegaly  present.  Cardiovascular: Normal rate, regular rhythm, normal heart sounds and intact distal pulses. Exam reveals no gallop and no friction rub.  No murmur heard. Pulmonary/Chest: Effort normal and breath sounds normal. No stridor. No respiratory distress. He has no wheezes. He has no rales. He exhibits no tenderness.  Abdominal: Soft. Bowel sounds are normal. He exhibits no distension and no mass. There is no tenderness. There is no rebound and no guarding.  Genitourinary: Rectum normal, prostate normal and penis normal.  Musculoskeletal: Normal range of motion. He exhibits no edema or tenderness.  Lymphadenopathy:    He has no cervical adenopathy.  Neurological: He is alert and oriented to person, place, and time. He has normal reflexes. He displays normal reflexes. No cranial nerve deficit. He exhibits normal muscle tone. Coordination normal.  Skin: Skin is warm. No rash noted. He is not diaphoretic. No erythema. No pallor.  Psychiatric: He has a normal mood and affect. His behavior is normal. Judgment and thought content normal.  Vitals reviewed.         Assessment & Plan:  General medical exam  Screening PSA (prostate specific antigen)  Physical exam today is completely normal except for his cholesterol level.  His ten-year risk of cardiovascular disease was calculated to be 9.2%.  Therefore the patient reluctantly agreed to try Lipitor at a low dose 10 mg a day and recheck fasting lipid panel and CMP in 3 months.  His blood pressure is excellent.  His PSA is normal.  His colonoscopy is up-to-date.  His immunizations are up-to-date.  He politely declines HIV and hepatitis C screening.  I did remove several skin tags from his  left axilla.  I anesthetized 12 separate skin tags with 0.1% lidocaine with epinephrine.  Using a pair of iris scissors, I removed the bluntly.  Hemostasis was achieved with Drysol and Neosporin.  Patient tolerated the procedure well without complications.

## 2018-03-07 IMAGING — DX DG LUMBAR SPINE COMPLETE 4+V
5 series · 5 of 5 positions shown · non-contrast
Comparison: No prior.

CLINICAL DATA: Sciatica.

EXAM:
LUMBAR SPINE - COMPLETE 4+ VIEW

[dg lumbar spine complete 4 +v (1 of 5)]
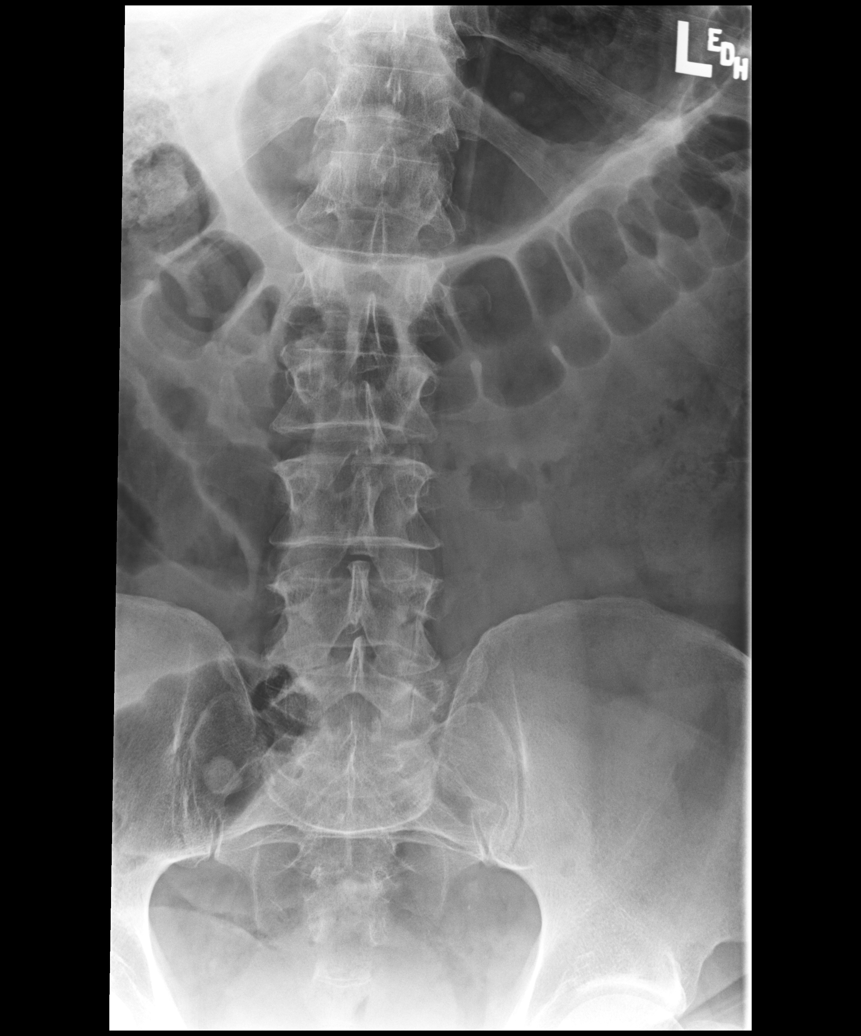

[dg lumbar spine complete 4 +v (2 of 5)]
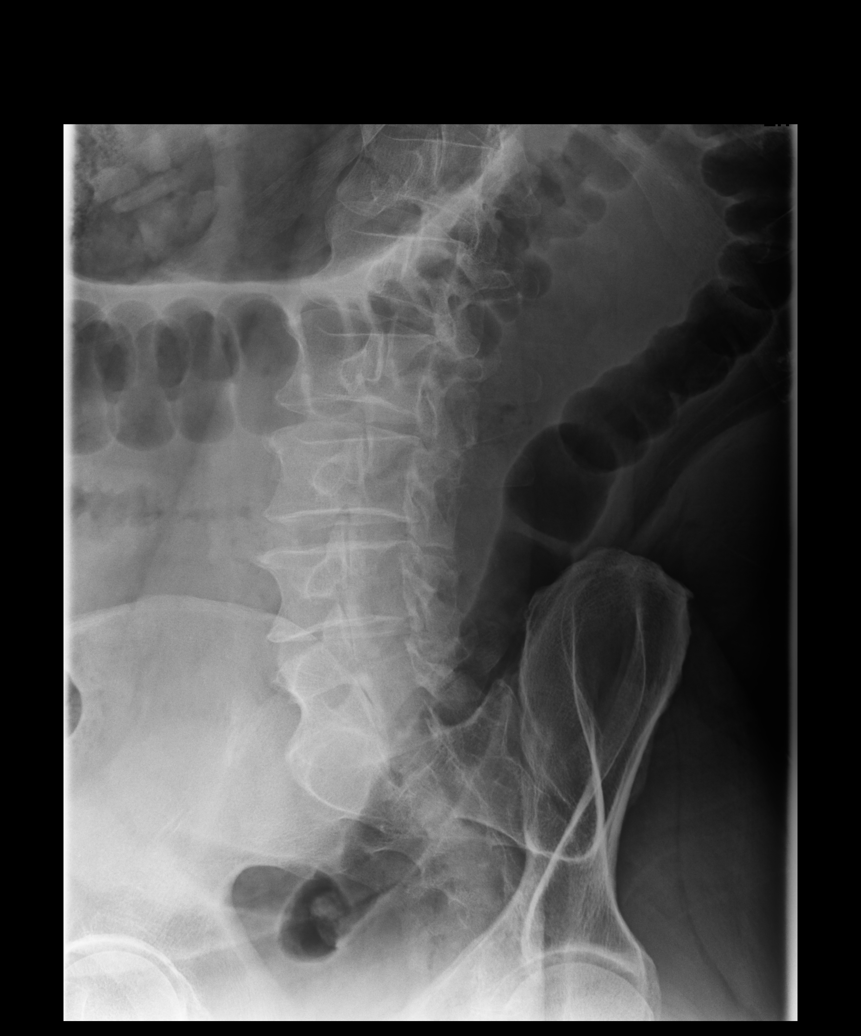

[dg lumbar spine complete 4 +v (3 of 5)]
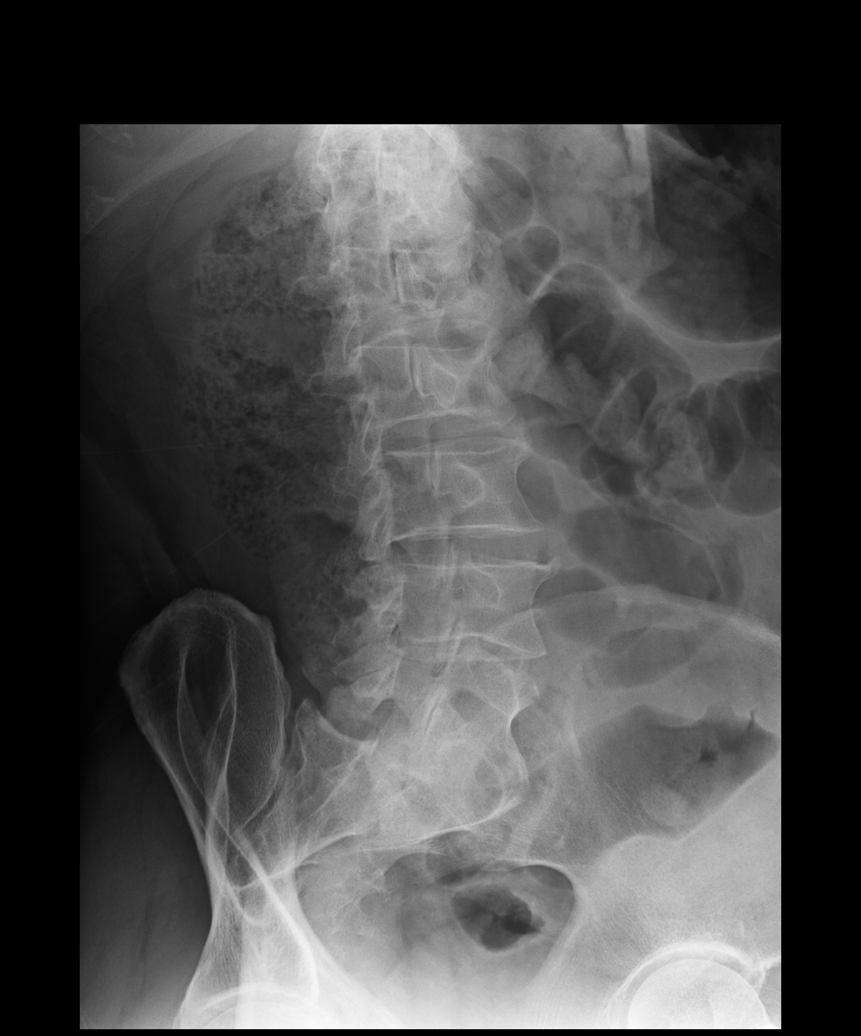

[dg lumbar spine complete 4 +v (4 of 5)]
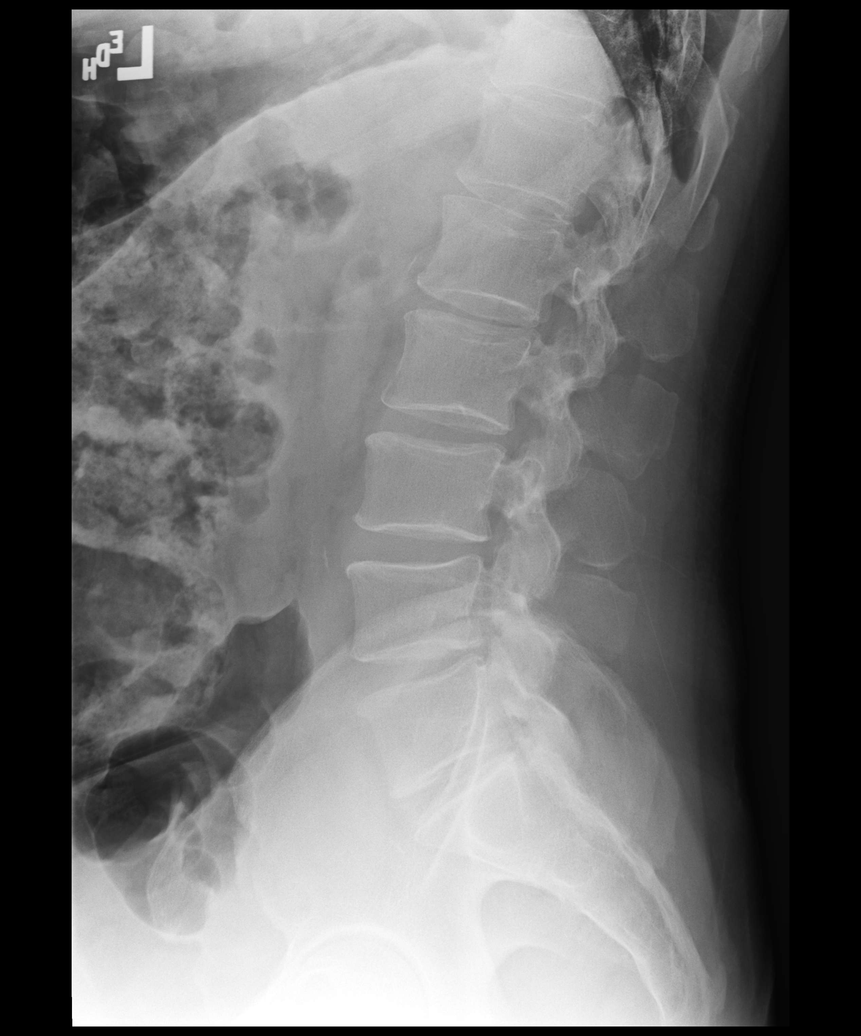

[dg lumbar spine complete 4 +v (5 of 5)]
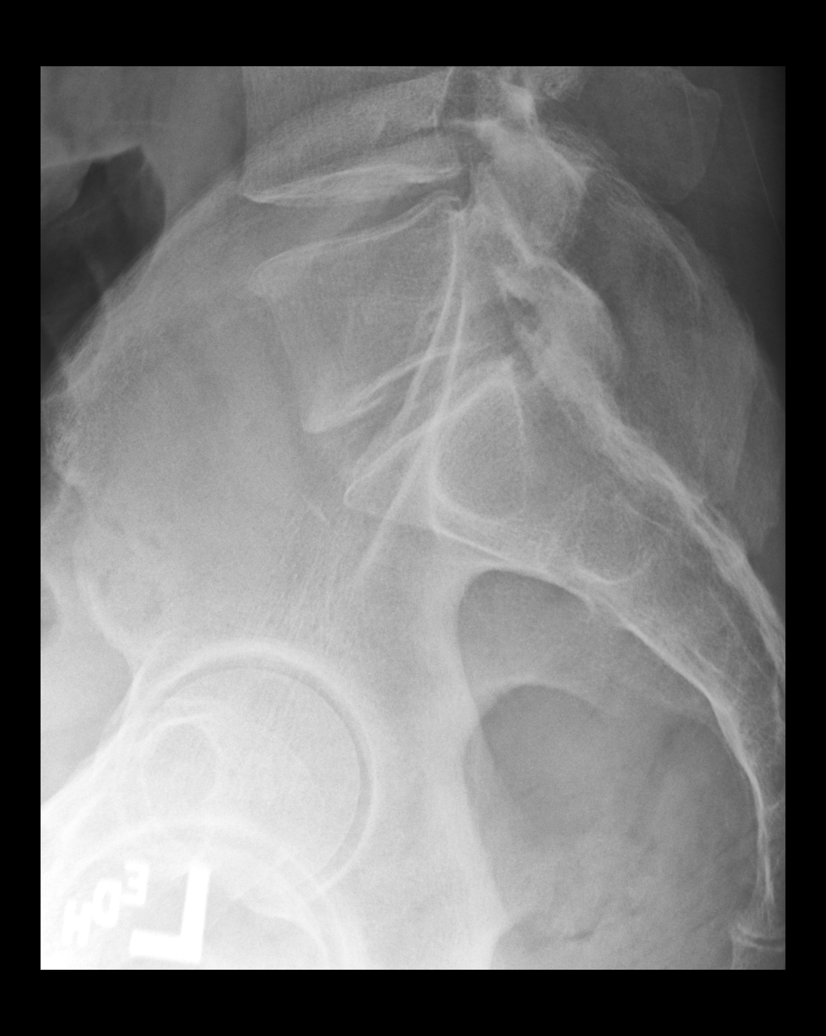

[5 of 5 positions shown; findings below may reference images not displayed]

FINDINGS: Mild lumbar spine scoliosis concave left. Degenerative change. No
acute bony abnormality identified. No evidence of fracture.
IMPRESSION: Mild lumbar spine scoliosis concave left. Diffuse degenerative
change. No acute bony abnormality.

## 2018-07-04 ENCOUNTER — Other Ambulatory Visit: Payer: Self-pay | Admitting: Family Medicine

## 2018-07-04 MED ORDER — ATORVASTATIN CALCIUM 10 MG PO TABS: 10.0000 mg | ORAL_TABLET | Freq: Every day | ORAL | 0 refills | Status: DC

## 2018-08-28 ENCOUNTER — Other Ambulatory Visit: Payer: BC Managed Care – PPO

## 2018-08-28 ENCOUNTER — Other Ambulatory Visit: Payer: Self-pay

## 2018-08-28 DIAGNOSIS — Z125 Encounter for screening for malignant neoplasm of prostate: Secondary | ICD-10-CM

## 2018-08-28 DIAGNOSIS — Z0000 Encounter for general adult medical examination without abnormal findings: Secondary | ICD-10-CM

## 2018-08-29 LAB — LIPID PANEL
Cholesterol: 137 mg/dL (ref <200)
HDL: 37 mg/dL — ABNORMAL LOW (ref >=40)
LDL Cholesterol (Calc): 78 mg/dL (calc)
Non-HDL Cholesterol (Calc): 100 mg/dL (calc) (ref <130)
Total CHOL/HDL Ratio: 3.7 (calc) (ref <5.0)
Triglycerides: 122 mg/dL (ref <150)

## 2018-08-29 LAB — COMPLETE METABOLIC PANEL WITH GFR
AG Ratio: 1.4 (calc) (ref 1.0–2.5)
ALT: 30 U/L (ref 9–46)
AST: 32 U/L (ref 10–35)
Albumin: 4.2 g/dL (ref 3.6–5.1)
Alkaline phosphatase (APISO): 59 U/L (ref 35–144)
BUN: 21 mg/dL (ref 7–25)
CO2: 26 mmol/L (ref 20–32)
Calcium: 9.1 mg/dL (ref 8.6–10.3)
Chloride: 106 mmol/L (ref 98–110)
Creat: 1.13 mg/dL (ref 0.70–1.25)
GFR, Est African American: 81 mL/min/{1.73_m2} (ref >=60)
GFR, Est Non African American: 70 mL/min/{1.73_m2} (ref >=60)
Globulin: 2.9 g/dL (calc) (ref 1.9–3.7)
Glucose, Bld: 101 mg/dL — ABNORMAL HIGH (ref 65–99)
Potassium: 4.7 mmol/L (ref 3.5–5.3)
Sodium: 140 mmol/L (ref 135–146)
Total Bilirubin: 0.6 mg/dL (ref 0.2–1.2)
Total Protein: 7.1 g/dL (ref 6.1–8.1)

## 2018-08-29 LAB — CBC WITH DIFFERENTIAL/PLATELET
Absolute Monocytes: 800 cells/uL (ref 200–950)
Basophils Absolute: 64 cells/uL (ref 0–200)
Basophils Relative: 0.8 %
Eosinophils Absolute: 384 cells/uL (ref 15–500)
Eosinophils Relative: 4.8 %
HCT: 46.2 % (ref 38.5–50.0)
Hemoglobin: 15.5 g/dL (ref 13.2–17.1)
Lymphs Abs: 1792 cells/uL (ref 850–3900)
MCH: 30.5 pg (ref 27.0–33.0)
MCHC: 33.5 g/dL (ref 32.0–36.0)
MCV: 90.9 fL (ref 80.0–100.0)
MPV: 12.3 fL (ref 7.5–12.5)
Monocytes Relative: 10 %
Neutro Abs: 4960 cells/uL (ref 1500–7800)
Neutrophils Relative %: 62 %
Platelets: 245 10*3/uL (ref 140–400)
RBC: 5.08 10*6/uL (ref 4.20–5.80)
RDW: 12.1 % (ref 11.0–15.0)
Total Lymphocyte: 22.4 %
WBC: 8 10*3/uL (ref 3.8–10.8)

## 2018-08-29 LAB — PSA: PSA: 2.2 ng/mL (ref <=4.0)

## 2018-09-11 ENCOUNTER — Encounter: Payer: Self-pay | Admitting: Family Medicine

## 2018-09-11 ENCOUNTER — Other Ambulatory Visit: Payer: Self-pay

## 2018-09-11 ENCOUNTER — Ambulatory Visit: Payer: BLUE CROSS/BLUE SHIELD | Admitting: Family Medicine

## 2018-09-11 VITALS — BP 130/90 | HR 88 | Temp 98.7°F | Resp 16 | Ht 69.5 in | Wt 233.0 lb

## 2018-09-11 DIAGNOSIS — Z0000 Encounter for general adult medical examination without abnormal findings: Secondary | ICD-10-CM | POA: Diagnosis not present

## 2018-09-11 NOTE — Progress Notes (Signed)
Subjective:    Patient ID: Victor Chandler, male    DOB: [birth year], 63 y.o.   MRN: [id number]  HPI  Patient is here today for complete physical exam.  He denies any concerns.  Since I last saw the patient, his cholesterol has improved dramatically.  LDL cholesterol is down approximately 70 points.  Colonoscopy was performed when he was 63 years old.  He was told that he was due again in 10 years so he will be due when he 26.  He denies any blood in his stool.  He denies any melena.  PSA has risen slightly.  Over the last 3 years his PSA has risen from 1.8 to just above 2.  He denies any lower urinary tract symptoms.  He denies any dysuria or hematuria.  He does exercise 3 to 5 days a week.  He walks about 30 minutes.  He is not eating any specific diet.  His BMI is elevated at greater than 33.  We had a long discussion today regarding a low carbohydrate diet.  Type 2 diabetes mellitus does run in his family.  I recommended avoiding simple carbohydrates such as bread, rice, potatoes, pasta, candy etc.  I recommended eating more protein.  Also recommended eating more complex carbohydrates in foods such as candy, squash, beans, lettuce, cabbage etc.  Also recommended avoiding junk food.  I recommended avoiding sodas beer and sweet tea and drinking more water.  For the most part the patient is doing this. Lab on 08/28/2018  Component Date Value Ref Range Status  . Cholesterol 08/28/2018 137  <200 mg/dL Final  . HDL 08/28/2018 37* > OR = 40 mg/dL Final  . Triglycerides 08/28/2018 122  <150 mg/dL Final  . LDL Cholesterol (Calc) 08/28/2018 78  mg/dL (calc) Final   Comment: Reference range: <100 . Desirable range <100 mg/dL for primary prevention;   <70 mg/dL for patients with CHD or diabetic patients  with > or = 2 CHD risk factors. Marland Kitchen LDL-C is now calculated using the Martin-Hopkins  calculation, which is a validated novel method providing  better accuracy than the Friedewald equation in the   estimation of LDL-C.  Cresenciano Genre et al. Annamaria Helling. 9563;875(64): 2061-2068  (http://education.[facility].com/faq/FAQ164)   . Total CHOL/HDL Ratio 08/28/2018 3.7  <5.0 (calc) Final  . Non-HDL Cholesterol (Calc) 08/28/2018 100  <130 mg/dL (calc) Final   Comment: For patients with diabetes plus 1 major ASCVD risk  factor, treating to a non-HDL-C goal of <100 mg/dL  (LDL-C of <70 mg/dL) is considered a therapeutic  option.   Marland Kitchen PSA 08/28/2018 2.2  < OR = 4.0 ng/mL Final   Comment: The total PSA value from this assay system is  standardized against the WHO standard. The test  result will be approximately 20% lower when compared  to the equimolar-standardized total PSA (Beckman  Coulter). Comparison of serial PSA results should be  interpreted with this fact in mind. . This test was performed using the Siemens  chemiluminescent method. Values obtained from  different assay methods cannot be used interchangeably. PSA levels, regardless of value, should not be interpreted as absolute evidence of the presence or absence of disease.   . WBC 08/28/2018 8.0  3.8 - 10.8 Thousand/uL Final  . RBC 08/28/2018 5.08  4.20 - 5.80 Million/uL Final  . Hemoglobin 08/28/2018 15.5  13.2 - 17.1 g/dL Final  . HCT 08/28/2018 46.2  38.5 - 50.0 % Final  . MCV 08/28/2018 90.9  80.0 - 100.0  fL Final  . MCH 08/28/2018 30.5  27.0 - 33.0 pg Final  . MCHC 08/28/2018 33.5  32.0 - 36.0 g/dL Final  . RDW 08/28/2018 12.1  11.0 - 15.0 % Final  . Platelets 08/28/2018 245  140 - 400 Thousand/uL Final  . MPV 08/28/2018 12.3  7.5 - 12.5 fL Final  . Neutro Abs 08/28/2018 4,960  1,500 - 7,800 cells/uL Final  . Lymphs Abs 08/28/2018 1,792  850 - 3,900 cells/uL Final  . Absolute Monocytes 08/28/2018 800  200 - 950 cells/uL Final  . Eosinophils Absolute 08/28/2018 384  15 - 500 cells/uL Final  . Basophils Absolute 08/28/2018 64  0 - 200 cells/uL Final  . Neutrophils Relative % 08/28/2018 62  % Final  . Total Lymphocyte  08/28/2018 22.4  % Final  . Monocytes Relative 08/28/2018 10.0  % Final  . Eosinophils Relative 08/28/2018 4.8  % Final  . Basophils Relative 08/28/2018 0.8  % Final  . Glucose, Bld 08/28/2018 101* 65 - 99 mg/dL Final   Comment: .            Fasting reference interval . For someone without known diabetes, a glucose value between 100 and 125 mg/dL is consistent with prediabetes and should be confirmed with a follow-up test. .   . BUN 08/28/2018 21  7 - 25 mg/dL Final  . Creat 08/28/2018 1.13  0.70 - 1.25 mg/dL Final   Comment: For patients >58 years of age, the reference limit for Creatinine is approximately 13% higher for people identified as African-American. .   . GFR, Est Non African American 08/28/2018 70  > OR = 60 mL/min/1.69m2 Final  . GFR, Est African American 08/28/2018 81  > OR = 60 mL/min/1.79m2 Final  . BUN/Creatinine Ratio 62/83/1517 NOT APPLICABLE  6 - 22 (calc) Final  . Sodium 08/28/2018 140  135 - 146 mmol/L Final  . Potassium 08/28/2018 4.7  3.5 - 5.3 mmol/L Final  . Chloride 08/28/2018 106  98 - 110 mmol/L Final  . CO2 08/28/2018 26  20 - 32 mmol/L Final  . Calcium 08/28/2018 9.1  8.6 - 10.3 mg/dL Final  . Total Protein 08/28/2018 7.1  6.1 - 8.1 g/dL Final  . Albumin 08/28/2018 4.2  3.6 - 5.1 g/dL Final  . Globulin 08/28/2018 2.9  1.9 - 3.7 g/dL (calc) Final  . AG Ratio 08/28/2018 1.4  1.0 - 2.5 (calc) Final  . Total Bilirubin 08/28/2018 0.6  0.2 - 1.2 mg/dL Final  . Alkaline phosphatase (APISO) 08/28/2018 59  35 - 144 U/L Final  . AST 08/28/2018 32  10 - 35 U/L Final  . ALT 08/28/2018 30  9 - 46 U/L Final   Past Medical History:  Diagnosis Date  . Allergy   . GERD (gastroesophageal reflux disease)   . Hyperlipidemia    Current Outpatient Medications on File Prior to Visit  Medication Sig Dispense Refill  . atorvastatin (LIPITOR) 10 MG tablet Take 1 tablet (10 mg total) by mouth daily. 90 tablet 0  . cetirizine (ZYRTEC) 10 MG tablet Take 10 mg by mouth  daily.    . cholecalciferol (VITAMIN D) 1000 units tablet Take 1,000 Units by mouth daily.    Marland Kitchen esomeprazole (NEXIUM) 20 MG capsule Take 20 mg by mouth as needed.    . fish oil-omega-3 fatty acids 1000 MG capsule Take 2 g by mouth daily.    . fluticasone (FLONASE) 50 MCG/ACT nasal spray Place 2 sprays into both nostrils daily.    Marland Kitchen  glucosamine-chondroitin 500-400 MG tablet Take 1 tablet by mouth 3 (three) times daily.    . tadalafil (CIALIS) 10 MG tablet Take 10 mg by mouth daily as needed for erectile dysfunction.    . vitamin B-12 (CYANOCOBALAMIN) 500 MCG tablet Take 500 mcg by mouth daily.     No current facility-administered medications on file prior to visit.    Allergies  Allergen Reactions  . Codeine   . Sulfa Antibiotics    Social History   Socioeconomic History  . Marital status: Married    Spouse name: Not on file  . Number of children: Not on file  . Years of education: Not on file  . Highest education level: Not on file  Occupational History  . Not on file  Social Needs  . Financial resource strain: Not on file  . Food insecurity    Worry: Not on file    Inability: Not on file  . Transportation needs    Medical: Not on file    Non-medical: Not on file  Tobacco Use  . Smoking status: Former Research scientist (life sciences)  . Smokeless tobacco: Former Network engineer and Sexual Activity  . Alcohol use: Not on file  . Drug use: Not on file  . Sexual activity: Not on file  Lifestyle  . Physical activity    Days per week: Not on file    Minutes per session: Not on file  . Stress: Not on file  Relationships  . Social Herbalist on phone: Not on file    Gets together: Not on file    Attends religious service: Not on file    Active member of club or organization: Not on file    Attends meetings of clubs or organizations: Not on file    Relationship status: Not on file  . Intimate partner violence    Fear of current or ex partner: Not on file    Emotionally abused: Not on  file    Physically abused: Not on file    Forced sexual activity: Not on file  Other Topics Concern  . Not on file  Social History Narrative  . Not on file   No family history on file.    Review of Systems  All other systems reviewed and are negative.      Objective:   Physical Exam Vitals signs reviewed.  Constitutional:      General: He is not in acute distress.    Appearance: He is well-developed. He is not diaphoretic.  HENT:     Head: Normocephalic and atraumatic.     Right Ear: External ear normal.     Left Ear: External ear normal.     Nose: Nose normal.     Mouth/Throat:     Pharynx: No oropharyngeal exudate.  Eyes:     General: No scleral icterus.       Right eye: No discharge.        Left eye: No discharge.     Conjunctiva/sclera: Conjunctivae normal.     Pupils: Pupils are equal, round, and reactive to light.  Neck:     Musculoskeletal: Normal range of motion and neck supple.     Thyroid: No thyromegaly.     Vascular: No JVD.     Trachea: No tracheal deviation.  Cardiovascular:     Rate and Rhythm: Normal rate and regular rhythm.     Heart sounds: Normal heart sounds. No murmur. No friction rub. No gallop.  Pulmonary:     Effort: Pulmonary effort is normal. No respiratory distress.     Breath sounds: Normal breath sounds. No stridor. No wheezing or rales.  Chest:     Chest wall: No tenderness.  Abdominal:     General: Bowel sounds are normal. There is no distension.     Palpations: Abdomen is soft. There is no mass.     Tenderness: There is no abdominal tenderness. There is no guarding or rebound.  Genitourinary:    Penis: Normal.      Prostate: Normal.     Rectum: Normal.  Musculoskeletal: Normal range of motion.        General: No tenderness.  Lymphadenopathy:     Cervical: No cervical adenopathy.  Skin:    General: Skin is warm.     Coloration: Skin is not pale.     Findings: No erythema or rash.  Neurological:     Mental Status: He is  alert and oriented to person, place, and time.     Cranial Nerves: No cranial nerve deficit.     Motor: No abnormal muscle tone.     Coordination: Coordination normal.     Deep Tendon Reflexes: Reflexes are normal and symmetric. Reflexes normal.  Psychiatric:        Behavior: Behavior normal.        Thought Content: Thought content normal.        Judgment: Judgment normal.           Assessment & Plan:  1. General medical exam  Patient's physical exam today is normal except for his elevated BMI.  As stated in the history of present illness I recommended an hour a day of aerobic exercise.  He may need to increase the intensity of his exercise.  I also recommended a low carbohydrate diet.  I recommended avoiding sodas and sweet tea and junk food.  I am very happy with his cholesterol.  I would make no changes in his atorvastatin.  His fasting blood sugar is mildly elevated at 101.  I believe also if he were to lose 10 pounds and eat a low carbohydrate diet this would likely resolve on its own.  PSA is within normal limits.  Colonoscopy is up-to-date.  Tetanus shot is up-to-date.  I did recommend the shingles vaccine, Shingrix.

## 2018-09-25 ENCOUNTER — Other Ambulatory Visit: Payer: Self-pay | Admitting: Family Medicine

## 2019-03-14 ENCOUNTER — Telehealth: Payer: Self-pay

## 2019-03-14 NOTE — Telephone Encounter (Signed)
Pt's wife call and stated that pt has dry itchy scalp. She would like to know if you can send in a Rx for betamethasone-ditropinate. Please advise.

## 2019-03-15 ENCOUNTER — Other Ambulatory Visit: Payer: Self-pay | Admitting: Family Medicine

## 2019-03-15 MED ORDER — BETAMETHASONE DIPROPIONATE 0.05 % EX CREA: TOPICAL_CREAM | Freq: Two times a day (BID) | CUTANEOUS | 0 refills | Status: DC

## 2019-03-15 NOTE — Telephone Encounter (Signed)
I sent cream to [facility]

## 2019-03-27 ENCOUNTER — Other Ambulatory Visit: Payer: Self-pay | Admitting: Family Medicine

## 2019-09-04 ENCOUNTER — Other Ambulatory Visit: Payer: Self-pay

## 2019-09-04 ENCOUNTER — Other Ambulatory Visit: Payer: BC Managed Care – PPO

## 2019-09-04 DIAGNOSIS — Z125 Encounter for screening for malignant neoplasm of prostate: Secondary | ICD-10-CM

## 2019-09-04 DIAGNOSIS — Z0000 Encounter for general adult medical examination without abnormal findings: Secondary | ICD-10-CM

## 2019-09-04 DIAGNOSIS — Z13220 Encounter for screening for lipoid disorders: Secondary | ICD-10-CM

## 2019-09-04 LAB — CBC WITH DIFFERENTIAL/PLATELET
Absolute Monocytes: 814 cells/uL (ref 200–950)
Basophils Absolute: 63 cells/uL (ref 0–200)
Basophils Relative: 0.8 %
Eosinophils Absolute: 363 cells/uL (ref 15–500)
Eosinophils Relative: 4.6 %
HCT: 46.5 % (ref 38.5–50.0)
Hemoglobin: 15.6 g/dL (ref 13.2–17.1)
Lymphs Abs: 1722 cells/uL (ref 850–3900)
MCH: 30.6 pg (ref 27.0–33.0)
MCHC: 33.5 g/dL (ref 32.0–36.0)
MCV: 91.2 fL (ref 80.0–100.0)
MPV: 12.2 fL (ref 7.5–12.5)
Monocytes Relative: 10.3 %
Neutro Abs: 4938 cells/uL (ref 1500–7800)
Neutrophils Relative %: 62.5 %
Platelets: 229 10*3/uL (ref 140–400)
RBC: 5.1 10*6/uL (ref 4.20–5.80)
RDW: 12.5 % (ref 11.0–15.0)
Total Lymphocyte: 21.8 %
WBC: 7.9 10*3/uL (ref 3.8–10.8)

## 2019-09-04 LAB — COMPLETE METABOLIC PANEL WITH GFR
AG Ratio: 1.6 (calc) (ref 1.0–2.5)
ALT: 29 U/L (ref 9–46)
AST: 31 U/L (ref 10–35)
Albumin: 4.4 g/dL (ref 3.6–5.1)
Alkaline phosphatase (APISO): 59 U/L (ref 35–144)
BUN: 17 mg/dL (ref 7–25)
CO2: 27 mmol/L (ref 20–32)
Calcium: 9.3 mg/dL (ref 8.6–10.3)
Chloride: 102 mmol/L (ref 98–110)
Creat: 1.19 mg/dL (ref 0.70–1.25)
GFR, Est African American: 76 mL/min/{1.73_m2} (ref >=60)
GFR, Est Non African American: 66 mL/min/{1.73_m2} (ref >=60)
Globulin: 2.8 g/dL (calc) (ref 1.9–3.7)
Glucose, Bld: 101 mg/dL — ABNORMAL HIGH (ref 65–99)
Potassium: 4.3 mmol/L (ref 3.5–5.3)
Sodium: 138 mmol/L (ref 135–146)
Total Bilirubin: 0.8 mg/dL (ref 0.2–1.2)
Total Protein: 7.2 g/dL (ref 6.1–8.1)

## 2019-09-04 LAB — LIPID PANEL
Cholesterol: 135 mg/dL (ref <200)
HDL: 38 mg/dL — ABNORMAL LOW (ref >=40)
LDL Cholesterol (Calc): 72 mg/dL (calc)
Non-HDL Cholesterol (Calc): 97 mg/dL (calc) (ref <130)
Total CHOL/HDL Ratio: 3.6 (calc) (ref <5.0)
Triglycerides: 172 mg/dL — ABNORMAL HIGH (ref <150)

## 2019-09-04 LAB — PSA: PSA: 1.9 ng/mL (ref <=4.0)

## 2019-09-19 ENCOUNTER — Other Ambulatory Visit: Payer: Self-pay | Admitting: Family Medicine

## 2019-10-01 ENCOUNTER — Ambulatory Visit (INDEPENDENT_AMBULATORY_CARE_PROVIDER_SITE_OTHER): Payer: BC Managed Care – PPO | Admitting: Family Medicine

## 2019-10-01 ENCOUNTER — Other Ambulatory Visit: Payer: Self-pay

## 2019-10-01 VITALS — BP 120/90 | HR 79 | Temp 97.6°F | Ht 70.0 in | Wt 235.0 lb

## 2019-10-01 DIAGNOSIS — Z0000 Encounter for general adult medical examination without abnormal findings: Secondary | ICD-10-CM | POA: Diagnosis not present

## 2019-10-01 DIAGNOSIS — Z1211 Encounter for screening for malignant neoplasm of colon: Secondary | ICD-10-CM | POA: Diagnosis not present

## 2019-10-01 NOTE — Progress Notes (Signed)
Subjective:    Patient ID: Victor Chandler, male    DOB: [birth year], 64 y.o.   MRN: [id number]  HPI  Patient is here today for complete physical exam.  Patient is due for a colonoscopy this fall.  He states that he has had a change in his bowel habits.  He states that the caliber of his stool has gotten smaller.  He is also having more frequent diarrhea.  He denies any bleeding.  He denies any melena or hematochezia.  He denies any weight loss or fevers or chills.  He also reports symptoms of overactive bladder.  He states that he has to urinate frequently.  He has very little warning.  When he is going on long car trips, he avoids drinking because he realizes that he will have to stop and urinate frequently.  He denies any hesitancy or dysuria.  Most recent lab work is listed below.  Patient is due for a tetanus shot, shingles vaccine, as well as the Covid vaccine.  We discussed the Covid vaccination today at length.  Patient is very resistant to receiving this vaccine.  I tried to assuage his concerns and discussed the safety of the vaccine however he declines vaccination at this point. Lab on 09/04/2019  Component Date Value Ref Range Status  . WBC 09/04/2019 7.9  3.8 - 10.8 Thousand/uL Final  . RBC 09/04/2019 5.10  4.20 - 5.80 Million/uL Final  . Hemoglobin 09/04/2019 15.6  13.2 - 17.1 g/dL Final  . HCT 09/04/2019 46.5  38 - 50 % Final  . MCV 09/04/2019 91.2  80.0 - 100.0 fL Final  . MCH 09/04/2019 30.6  27.0 - 33.0 pg Final  . MCHC 09/04/2019 33.5  32.0 - 36.0 g/dL Final  . RDW 09/04/2019 12.5  11.0 - 15.0 % Final  . Platelets 09/04/2019 229  140 - 400 Thousand/uL Final  . MPV 09/04/2019 12.2  7.5 - 12.5 fL Final  . Neutro Abs 09/04/2019 4,938  1,500 - 7,800 cells/uL Final  . Lymphs Abs 09/04/2019 1,722  850 - 3,900 cells/uL Final  . Absolute Monocytes 09/04/2019 814  200 - 950 cells/uL Final  . Eosinophils Absolute 09/04/2019 363  15 - 500 cells/uL Final  . Basophils Absolute 09/04/2019  63  0 - 200 cells/uL Final  . Neutrophils Relative % 09/04/2019 62.5  % Final  . Total Lymphocyte 09/04/2019 21.8  % Final  . Monocytes Relative 09/04/2019 10.3  % Final  . Eosinophils Relative 09/04/2019 4.6  % Final  . Basophils Relative 09/04/2019 0.8  % Final  . Glucose, Bld 09/04/2019 101* 65 - 99 mg/dL Final   Comment: .            Fasting reference interval . For someone without known diabetes, a glucose value between 100 and 125 mg/dL is consistent with prediabetes and should be confirmed with a follow-up test. .   . BUN 09/04/2019 17  7 - 25 mg/dL Final  . Creat 09/04/2019 1.19  0.70 - 1.25 mg/dL Final   Comment: For patients >55 years of age, the reference limit for Creatinine is approximately 13% higher for people identified as African-American. .   . GFR, Est Non African American 09/04/2019 66  > OR = 60 mL/min/1.76m2 Final  . GFR, Est African American 09/04/2019 76  > OR = 60 mL/min/1.22m2 Final  . BUN/Creatinine Ratio 57/03/7791 NOT APPLICABLE  6 - 22 (calc) Final  . Sodium 09/04/2019 138  135 - 146 mmol/L Final  .  Potassium 09/04/2019 4.3  3.5 - 5.3 mmol/L Final  . Chloride 09/04/2019 102  98 - 110 mmol/L Final  . CO2 09/04/2019 27  20 - 32 mmol/L Final  . Calcium 09/04/2019 9.3  8.6 - 10.3 mg/dL Final  . Total Protein 09/04/2019 7.2  6.1 - 8.1 g/dL Final  . Albumin 09/04/2019 4.4  3.6 - 5.1 g/dL Final  . Globulin 09/04/2019 2.8  1.9 - 3.7 g/dL (calc) Final  . AG Ratio 09/04/2019 1.6  1.0 - 2.5 (calc) Final  . Total Bilirubin 09/04/2019 0.8  0.2 - 1.2 mg/dL Final  . Alkaline phosphatase (APISO) 09/04/2019 59  35 - 144 U/L Final  . AST 09/04/2019 31  10 - 35 U/L Final  . ALT 09/04/2019 29  9 - 46 U/L Final  . PSA 09/04/2019 1.9  < OR = 4.0 ng/mL Final   Comment: The total PSA value from this assay system is  standardized against the WHO standard. The test  result will be approximately 20% lower when compared  to the equimolar-standardized total PSA (Beckman    Coulter). Comparison of serial PSA results should be  interpreted with this fact in mind. . This test was performed using the Siemens  chemiluminescent method. Values obtained from  different assay methods cannot be used interchangeably. PSA levels, regardless of value, should not be interpreted as absolute evidence of the presence or absence of disease.   . Cholesterol 09/04/2019 135  <200 mg/dL Final  . HDL 09/04/2019 38* > OR = 40 mg/dL Final  . Triglycerides 09/04/2019 172* <150 mg/dL Final  . LDL Cholesterol (Calc) 09/04/2019 72  mg/dL (calc) Final   Comment: Reference range: <100 . Desirable range <100 mg/dL for primary prevention;   <70 mg/dL for patients with CHD or diabetic patients  with > or = 2 CHD risk factors. Marland Kitchen LDL-C is now calculated using the Martin-Hopkins  calculation, which is a validated novel method providing  better accuracy than the Friedewald equation in the  estimation of LDL-C.  Cresenciano Genre et al. Annamaria Helling. 9417;408(14): 2061-2068  (http://education.[facility].com/faq/FAQ164)   . Total CHOL/HDL Ratio 09/04/2019 3.6  <5.0 (calc) Final  . Non-HDL Cholesterol (Calc) 09/04/2019 97  <130 mg/dL (calc) Final   Comment: For patients with diabetes plus 1 major ASCVD risk  factor, treating to a non-HDL-C goal of <100 mg/dL  (LDL-C of <70 mg/dL) is considered a therapeutic  option.    Past Medical History:  Diagnosis Date  . Allergy   . GERD (gastroesophageal reflux disease)   . Hyperlipidemia    Current Outpatient Medications on File Prior to Visit  Medication Sig Dispense Refill  . atorvastatin (LIPITOR) 10 MG tablet TAKE 1 TABLET BY MOUTH EVERY DAY 90 tablet 1  . betamethasone dipropionate 0.05 % cream Apply topically 2 (two) times daily. 30 g 0  . cetirizine (ZYRTEC) 10 MG tablet Take 10 mg by mouth daily.    . cholecalciferol (VITAMIN D) 1000 units tablet Take 1,000 Units by mouth daily.    Marland Kitchen esomeprazole (NEXIUM) 20 MG capsule Take 20 mg by mouth  as needed.    . fish oil-omega-3 fatty acids 1000 MG capsule Take 2 g by mouth daily.    . fluticasone (FLONASE) 50 MCG/ACT nasal spray Place 2 sprays into both nostrils daily.    Marland Kitchen glucosamine-chondroitin 500-400 MG tablet Take 1 tablet by mouth 3 (three) times daily.    . tadalafil (CIALIS) 10 MG tablet Take 10 mg by mouth daily as needed  for erectile dysfunction.    . vitamin B-12 (CYANOCOBALAMIN) 500 MCG tablet Take 500 mcg by mouth daily.     No current facility-administered medications on file prior to visit.   Allergies  Allergen Reactions  . Codeine   . Sulfa Antibiotics    Social History   Socioeconomic History  . Marital status: Married    Spouse name: Not on file  . Number of children: Not on file  . Years of education: Not on file  . Highest education level: Not on file  Occupational History  . Not on file  Tobacco Use  . Smoking status: Former Research scientist (life sciences)  . Smokeless tobacco: Former Network engineer and Sexual Activity  . Alcohol use: Not on file  . Drug use: Not on file  . Sexual activity: Not on file  Other Topics Concern  . Not on file  Social History Narrative  . Not on file   Social Determinants of Health   Financial Resource Strain:   . Difficulty of Paying Living Expenses:   Food Insecurity:   . Worried About Charity fundraiser in the Last Year:   . Arboriculturist in the Last Year:   Transportation Needs:   . Film/video editor (Medical):   Marland Kitchen Lack of Transportation (Non-Medical):   Physical Activity:   . Days of Exercise per Week:   . Minutes of Exercise per Session:   Stress:   . Feeling of Stress :   Social Connections:   . Frequency of Communication with Friends and Family:   . Frequency of Social Gatherings with Friends and Family:   . Attends Religious Services:   . Active Member of Clubs or Organizations:   . Attends Archivist Meetings:   Marland Kitchen Marital Status:   Intimate Partner Violence:   . Fear of Current or Ex-Partner:     . Emotionally Abused:   Marland Kitchen Physically Abused:   . Sexually Abused:    No family history on file.    Review of Systems  All other systems reviewed and are negative.      Objective:   Physical Exam Vitals reviewed.  Constitutional:      General: He is not in acute distress.    Appearance: He is well-developed. He is not diaphoretic.  HENT:     Head: Normocephalic and atraumatic.     Right Ear: External ear normal.     Left Ear: External ear normal.     Nose: Nose normal.     Mouth/Throat:     Pharynx: No oropharyngeal exudate.  Eyes:     General: No scleral icterus.       Right eye: No discharge.        Left eye: No discharge.     Conjunctiva/sclera: Conjunctivae normal.     Pupils: Pupils are equal, round, and reactive to light.  Neck:     Thyroid: No thyromegaly.     Vascular: No JVD.     Trachea: No tracheal deviation.  Cardiovascular:     Rate and Rhythm: Normal rate and regular rhythm.     Heart sounds: Normal heart sounds. No murmur heard.  No friction rub. No gallop.   Pulmonary:     Effort: Pulmonary effort is normal. No respiratory distress.     Breath sounds: Normal breath sounds. No stridor. No wheezing or rales.  Chest:     Chest wall: No tenderness.  Abdominal:     General: Bowel sounds  are normal. There is no distension.     Palpations: Abdomen is soft. There is no mass.     Tenderness: There is no abdominal tenderness. There is no guarding or rebound.  Genitourinary:    Penis: Normal.      Prostate: Normal.     Rectum: Normal.  Musculoskeletal:        General: No tenderness. Normal range of motion.     Cervical back: Normal range of motion and neck supple.  Lymphadenopathy:     Cervical: No cervical adenopathy.  Skin:    General: Skin is warm.     Coloration: Skin is not pale.     Findings: No erythema or rash.  Neurological:     Mental Status: He is alert and oriented to person, place, and time.     Cranial Nerves: No cranial nerve  deficit.     Motor: No abnormal muscle tone.     Coordination: Coordination normal.     Deep Tendon Reflexes: Reflexes are normal and symmetric. Reflexes normal.  Psychiatric:        Behavior: Behavior normal.        Thought Content: Thought content normal.        Judgment: Judgment normal.           Assessment & Plan:   Colon cancer screening - Plan: Ambulatory referral to Gastroenterology  General medical exam  Physical exam today is normal aside from an elevated BMI however the patient has a muscular build and the BMI overestimates his obesity.  Cholesterol is excellent except for mildly decreased HDL cholesterol.  He is trying to exercise regularly.  He is not smoking.  He is not using smokeless tobacco.  I congratulated the patient on his excellent lab work.  His PSA is excellent.  I believe he may have symptoms of overactive bladder however at the present time he is not interested in any medication for this.  I will schedule the patient for a colonoscopy.  I strongly recommended the Covid vaccine.  Also recommended the shingles vaccine and a tetanus shot.  He politely declines these today.  Reassess in 1 year or sooner if needed.

## 2020-03-10 ENCOUNTER — Other Ambulatory Visit: Payer: Self-pay | Admitting: Family Medicine

## 2020-04-10 ENCOUNTER — Encounter: Payer: Self-pay | Admitting: Gastroenterology

## 2020-05-22 ENCOUNTER — Other Ambulatory Visit: Payer: Self-pay

## 2020-05-22 ENCOUNTER — Ambulatory Visit (AMBULATORY_SURGERY_CENTER): Payer: Self-pay | Admitting: *Deleted

## 2020-05-22 VITALS — Ht 70.0 in | Wt 240.0 lb

## 2020-05-22 DIAGNOSIS — Z1211 Encounter for screening for malignant neoplasm of colon: Secondary | ICD-10-CM

## 2020-05-22 MED ORDER — PEG 3350-KCL-NA BICARB-NACL 420 G PO SOLR: 4000.0000 mL | Freq: Once | ORAL | 0 refills | Status: AC

## 2020-05-22 NOTE — Progress Notes (Addendum)
No egg or soy allergy known to patient  No issues with past sedation with any surgeries or procedures Patient denies ever being told they had issues or difficulty with intubation  No FH of Malignant Hyperthermia No diet pills per patient No home 02 use per patient  No blood thinners per patient  Pt denies issues with constipation  No A fib or A flutter  EMMI video to pt or via [place] 19 guidelines implemented in PV today with Pt and RN  Pt is fully vaccinated  for Covid    Virtual pre visit completed.  Wife assisted with the visit over speaker phone as patient was driving. Instructions mailed.  Due to the COVID-19 pandemic we are asking patients to follow certain guidelines.  Pt aware of COVID protocols and [facility] guidelines

## 2020-06-05 ENCOUNTER — Other Ambulatory Visit: Payer: Self-pay

## 2020-06-05 ENCOUNTER — Encounter: Payer: Self-pay | Admitting: Gastroenterology

## 2020-06-05 ENCOUNTER — Ambulatory Visit (AMBULATORY_SURGERY_CENTER): Payer: BC Managed Care – PPO | Admitting: Gastroenterology

## 2020-06-05 VITALS — BP 122/84 | HR 72 | Temp 97.1°F | Resp 13 | Ht 70.0 in | Wt 240.0 lb

## 2020-06-05 DIAGNOSIS — K529 Noninfective gastroenteritis and colitis, unspecified: Secondary | ICD-10-CM | POA: Diagnosis not present

## 2020-06-05 DIAGNOSIS — Z1211 Encounter for screening for malignant neoplasm of colon: Secondary | ICD-10-CM

## 2020-06-05 DIAGNOSIS — K5000 Crohn's disease of small intestine without complications: Secondary | ICD-10-CM | POA: Diagnosis not present

## 2020-06-05 DIAGNOSIS — K633 Ulcer of intestine: Secondary | ICD-10-CM

## 2020-06-05 DIAGNOSIS — K5080 Crohn's disease of both small and large intestine without complications: Secondary | ICD-10-CM | POA: Diagnosis not present

## 2020-06-05 DIAGNOSIS — D122 Benign neoplasm of ascending colon: Secondary | ICD-10-CM

## 2020-06-05 MED ORDER — SODIUM CHLORIDE 0.9 % IV SOLN: 500.0000 mL | Freq: Once | INTRAVENOUS | Status: DC

## 2020-06-05 NOTE — Progress Notes (Signed)
Report to PACU, RN, vss, BBS= Clear.  

## 2020-06-05 NOTE — Patient Instructions (Signed)
Await pathology results   YOU HAD AN ENDOSCOPIC PROCEDURE TODAY AT THE [facility]:   Refer to the procedure report that was given to you for any specific questions about what was found during the examination.  If the procedure report does not answer your questions, please call your gastroenterologist to clarify.  If you requested that your care partner not be given the details of your procedure findings, then the procedure report has been included in a sealed envelope for you to review at your convenience later.  YOU SHOULD EXPECT: Some feelings of bloating in the abdomen. Passage of more gas than usual.  Walking can help get rid of the air that was put into your GI tract during the procedure and reduce the bloating. If you had a lower endoscopy (such as a colonoscopy or flexible sigmoidoscopy) you may notice spotting of blood in your stool or on the toilet paper. If you underwent a bowel prep for your procedure, you may not have a normal bowel movement for a few days.  Please Note:  You might notice some irritation and congestion in your nose or some drainage.  This is from the oxygen used during your procedure.  There is no need for concern and it should clear up in a day or so.  SYMPTOMS TO REPORT IMMEDIATELY:   Following lower endoscopy (colonoscopy or flexible sigmoidoscopy):  Excessive amounts of blood in the stool  Significant tenderness or worsening of abdominal pains  Swelling of the abdomen that is new, acute  Fever of 100F or higher  For urgent or emergent issues, a gastroenterologist can be reached at any hour by calling [phone]. Do not use MyChart messaging for urgent concerns.    DIET:  We do recommend a small meal at first, but then you may proceed to your regular diet.  Drink plenty of fluids but you should avoid alcoholic beverages for 24 hours.  ACTIVITY:  You should plan to take it easy for the rest of today and you should NOT DRIVE or use heavy  machinery until tomorrow (because of the sedation medicines used during the test).    FOLLOW UP: Our staff will call the number listed on your records 48-72 hours following your procedure to check on you and address any questions or concerns that you may have regarding the information given to you following your procedure. If we do not reach you, we will leave a message.  We will attempt to reach you two times.  During this call, we will ask if you have developed any symptoms of COVID 19. If you develop any symptoms (ie: fever, flu-like symptoms, shortness of breath, cough etc.) before then, please call [phone].  If you test positive for Covid 19 in the 2 weeks post procedure, please call and report this information to us.    If any biopsies were taken you will be contacted by phone or by letter within the next 1-3 weeks.  Please call us at [phone] if you have not heard about the biopsies in 3 weeks.    SIGNATURES/CONFIDENTIALITY: You and/or your care partner have signed paperwork which will be entered into your electronic medical record.  These signatures attest to the fact that that the information above on your After Visit Summary has been reviewed and is understood.  Full responsibility of the confidentiality of this discharge information lies with you and/or your care-partner. 

## 2020-06-05 NOTE — Progress Notes (Signed)
VS-SM  Pt's states no medical or surgical changes since previsit or office visit.  

## 2020-06-05 NOTE — Progress Notes (Signed)
Called to room to assist during endoscopic procedure.  Patient ID and intended procedure confirmed with present staff. Received instructions for my participation in the procedure from the performing physician.  

## 2020-06-05 NOTE — Op Note (Signed)
[facility] Patient Name: Victor Chandler Procedure Date: 06/05/2020 7:26 AM MRN: [id number] Endoscopist: Ladene Artist , MD Age: 65 Referring MD:  Date of Birth: 1955-10-20 Gender: Male Account #: [id number] Procedure:                Colonoscopy Indications:              Screening for colorectal malignant neoplasm Medicines:                Monitored Anesthesia Care Procedure:                Pre-Anesthesia Assessment:                           - Prior to the procedure, a History and Physical                            was performed, and patient medications and                            allergies were reviewed. The patient's tolerance of                            previous anesthesia was also reviewed. The risks                            and benefits of the procedure and the sedation                            options and risks were discussed with the patient.                            All questions were answered, and informed consent                            was obtained. Prior Anticoagulants: The patient has                            taken no previous anticoagulant or antiplatelet                            agents. ASA Grade Assessment: II - A patient with                            mild systemic disease. After reviewing the risks                            and benefits, the patient was deemed in                            satisfactory condition to undergo the procedure.                           After obtaining informed consent, the colonoscope  was passed under direct vision. Throughout the                            procedure, the patient's blood pressure, pulse, and                            oxygen saturations were monitored continuously. The                            Olympus CF-HQ190L ([id number]) Colonoscope was                            introduced through the anus and advanced to the the                            terminal ileum, with  identification of the                            appendiceal orifice and IC valve. The ileocecal                            valve, appendiceal orifice, and rectum were                            photographed. The quality of the bowel preparation                            was good. The colonoscopy was performed without                            difficulty. The patient tolerated the procedure                            well. Scope In: 8:03:32 AM Scope Out: 8:21:51 AM Scope Withdrawal Time: 0 hours 15 minutes 53 seconds  Total Procedure Duration: 0 hours 18 minutes 19 seconds  Findings:                 The perianal and digital rectal examinations were                            normal.                           A 6 mm polyp was found in the ascending colon. The                            polyp was sessile. The polyp was removed with a                            cold snare. Resection and retrieval were complete.                           A few localized non-bleeding erosions were found at  the ileocecal valve. No stigmata of recent bleeding                            were seen. Biopsies were taken with a cold forceps                            for histology.                           The terminal ileum contained several ulcers and                            erosions. No bleeding was present. No stigmata of                            recent bleeding were seen. Biopsies were taken with                            a cold forceps for histology.                           Internal hemorrhoids were found during                            retroflexion. The hemorrhoids were small and Grade                            I (internal hemorrhoids that do not prolapse).                           The exam was otherwise without abnormality on                            direct and retroflexion views. Complications:            No immediate complications. Estimated blood loss:                             None. Estimated Blood Loss:     Estimated blood loss: none. Impression:               - One 6 mm polyp in the ascending colon, removed                            with a cold snare. Resected and retrieved.                           - A few erosions at the ileocecal valve. Biopsied.                           - Several ulcers and erosions in the terminal                            ileum. Biopsied.                           -  Internal hemorrhoids.                           - The examination was otherwise normal on direct                            and retroflexion views. Recommendation:           - Repeat colonoscopy after studies are complete for                            surveillance based on pathology results.                           - Patient has a contact number available for                            emergencies. The signs and symptoms of potential                            delayed complications were discussed with the                            patient. Return to normal activities tomorrow.                            Written discharge instructions were provided to the                            patient.                           - Resume previous diet.                           - Continue present medications.                           - Await pathology results.                           - Return to GI office in 1 month. Ladene Artist, MD 06/05/2020 8:27:39 AM This report has been signed electronically.

## 2020-06-09 ENCOUNTER — Telehealth: Payer: Self-pay

## 2020-06-09 ENCOUNTER — Telehealth: Payer: Self-pay | Admitting: *Deleted

## 2020-06-09 NOTE — Telephone Encounter (Signed)
  Follow up Call-  Call back number 06/05/2020  Post procedure Call Back phone  # [phone] cell  Permission to leave phone message Yes  Some recent data might be hidden     Patient questions:  Do you have a fever, pain , or abdominal swelling? No. Pain Score  0 *  Have you tolerated food without any problems? Yes.    Have you been able to return to your normal activities? Yes.    Do you have any questions about your discharge instructions: Diet   No. Medications  No. Follow up visit  No.  Do you have questions or concerns about your Care? No.  Actions: * If pain score is 4 or above: No action needed, pain <4.  1. Have you developed a fever since your procedure? no  2.   Have you had an respiratory symptoms (SOB or cough) since your procedure? no  3.   Have you tested positive for COVID 19 since your procedure no  4.   Have you had any family members/close contacts diagnosed with the COVID 19 since your procedure?  no   If yes to any of these questions please route to Joylene John, RN and Joella Prince, RN

## 2020-06-09 NOTE — Telephone Encounter (Signed)
Attempted to reach patient for post-procedure f/u call. No answer. Left message that we will make another attempt to reach him later today and for him to please not hesitate to call us if he has any questions/concerns regarding his care. 

## 2020-06-23 ENCOUNTER — Encounter: Payer: Self-pay | Admitting: Gastroenterology

## 2020-07-24 ENCOUNTER — Ambulatory Visit: Payer: BC Managed Care – PPO | Admitting: Gastroenterology

## 2020-07-24 ENCOUNTER — Encounter: Payer: Self-pay | Admitting: Gastroenterology

## 2020-07-24 VITALS — BP 130/80 | HR 76 | Ht 70.0 in | Wt 242.6 lb

## 2020-07-24 DIAGNOSIS — K5000 Crohn's disease of small intestine without complications: Secondary | ICD-10-CM | POA: Diagnosis not present

## 2020-07-24 DIAGNOSIS — Z86010 Personal history of colonic polyps: Secondary | ICD-10-CM | POA: Diagnosis not present

## 2020-07-24 MED ORDER — BUDESONIDE 3 MG PO CPEP: 9.0000 mg | ORAL_CAPSULE | Freq: Every day | ORAL | 2 refills | Status: DC

## 2020-07-24 NOTE — Progress Notes (Signed)
    History of Present Illness: This is a 65 year old male returning for follow-up of ileocolitis.  He is accompanied by his wife.  He states on a colonoscopy performed elsewhere about 10 years ago he was told of "spots in his colon" suggesting mild Crohn's disease.  His symptoms have always been mild.  He has not been treated for Crohn's disease.  Recently he has had intermittent difficulties with right lower quadrant pain and diarrhea.  His symptoms are very mild.  Recent colonoscopy showed chronic active ileitis and chronic mildly active colitis at the IC valve.  Colonoscopy 05/2020 - One 6 mm polyp in the ascending colon, removed with a cold snare. Resected and retrieved. - A few erosions at the ileocecal valve. Biopsied. - Several ulcers and erosions in the terminal ileum. Biopsied. - Internal hemorrhoids.  1. Surgical [P], small bowel, terminal ileum - CHRONIC ACTIVE ILEITIS. - NO DYSPLASIA OR MALIGNANCY. 2. Surgical [P], small valve, ileocecal valve - CHRONIC MILDLY ACTIVE COLITIS. - NO DYSPLASIA OR MALIGNANCY. 3. Surgical [P], colon, ascending, polyp (1) - SESSILE SERRATED POLYP. - NO DYSPLASIA OR MALIGNANCY.   - The examination was otherwise normal on direct and retroflexion views. Current Medications, Allergies, Past Medical History, Past Surgical History, Family History and Social History were reviewed in [employer] record.   Physical Exam: General: Well developed, well nourished, no acute distress Head: Normocephalic and atraumatic Eyes: Sclerae anicteric, EOMI Ears: Normal auditory acuity Mouth: Not examined, mask on during Covid-19 pandemic Lungs: Clear throughout to auscultation Heart: Regular rate and rhythm; no murmurs, rubs or bruits Abdomen: Soft, non tender and non distended. No masses, hepatosplenomegaly or hernias noted. Normal Bowel sounds Rectal: Not done Musculoskeletal: Symmetrical with no gross deformities  Pulses:  Normal pulses  noted Extremities: No clubbing, cyanosis, edema or deformities noted Neurological: Alert oriented x 4, grossly nonfocal Psychological:  Alert and cooperative. Normal mood and affect   Assessment and Recommendations:  1. Crohn's ileocolitis, mild.  Intermittent right lower quadrant pain and intermittent diarrhea without other symptoms.  We discussed his endoscopic and biopsy findings, Crohn's disease and treatment options.  I addressed his questions to his satisfaction.  After a discussion we decided to begin budesonide 9 mg daily for the next 3 months and assess response.  REV in 3 months.  2. Personal history of SSP.  A 5-year interval surveillance colonoscopy is recommended in April 2027.

## 2020-07-24 NOTE — Patient Instructions (Signed)
We have sent the following medications to your pharmacy for you to pick up at your convenience: budesonide 3 mg taking 3 tablets by mouth daily.   Thank you for choosing me and [facility].  Pricilla Riffle. Dagoberto Ligas., MD., Marval Regal

## 2020-09-04 ENCOUNTER — Other Ambulatory Visit: Payer: Self-pay | Admitting: Family Medicine

## 2020-09-17 ENCOUNTER — Other Ambulatory Visit: Payer: Self-pay

## 2020-09-17 MED ORDER — BUDESONIDE 3 MG PO CPEP: 9.0000 mg | ORAL_CAPSULE | Freq: Every day | ORAL | 1 refills | Status: DC

## 2020-09-22 ENCOUNTER — Other Ambulatory Visit: Payer: Self-pay

## 2020-09-22 MED ORDER — BUDESONIDE 3 MG PO CPEP: 9.0000 mg | ORAL_CAPSULE | Freq: Every day | ORAL | 1 refills | Status: DC

## 2020-10-23 ENCOUNTER — Encounter: Payer: Self-pay | Admitting: Gastroenterology

## 2020-10-23 ENCOUNTER — Ambulatory Visit: Payer: BC Managed Care – PPO | Admitting: Gastroenterology

## 2020-10-23 VITALS — BP 142/70 | HR 104 | Ht 70.0 in | Wt 238.0 lb

## 2020-10-23 DIAGNOSIS — K5080 Crohn's disease of both small and large intestine without complications: Secondary | ICD-10-CM

## 2020-10-23 MED ORDER — BUDESONIDE 3 MG PO CPEP: 9.0000 mg | ORAL_CAPSULE | Freq: Every day | ORAL | 3 refills | Status: DC

## 2020-10-23 NOTE — Patient Instructions (Signed)
If you are age 63 or younger, your body mass index should be between 19-25. Your Body mass index is 34.15 kg/m. If this is out of the aformentioned range listed, please consider follow up with your Primary Care Provider.   __________________________________________________________  The [facility] providers would like to encourage you to use [facility] to communicate with providers for non-urgent requests or questions.  Due to long hold times on the telephone, sending your provider a message by [facility] may be a faster and more efficient way to get a response.  Please allow 48 business hours for a response.  Please remember that this is for non-urgent requests.   You will follow up in our office in 6 months. We will contact you to schedule this appointment.  We have sent the following medications to your pharmacy for you to pick up at your convenience:  Budesonide  Thank you for entrusting me with your care and choosing [facility].  Dr Fuller Plan

## 2020-10-23 NOTE — Progress Notes (Signed)
    History of Present Illness: This is a 65 year old male with Crohn's ileocolitis.  Since beginning Entocort he is noticed resolution of his mild diarrhea he now generally has 1 formed bowel movement daily and very rarely notes right lower quadrant pain.  Current Medications, Allergies, Past Medical History, Past Surgical History, Family History and Social History were reviewed in [employer] record.   Physical Exam: General: Well developed, well nourished, no acute distress Head: Normocephalic and atraumatic Eyes: Sclerae anicteric, EOMI Ears: Normal auditory acuity Mouth: Not examined, mask on during Covid-19 pandemic Lungs: Clear throughout to auscultation Heart: Regular rate and rhythm; no murmurs, rubs or bruits Abdomen: Soft, non tender and non distended. No masses, hepatosplenomegaly or hernias noted. Normal Bowel sounds Rectal: Not done Musculoskeletal: Symmetrical with no gross deformities  Pulses:  Normal pulses noted Extremities: No clubbing, cyanosis, edema or deformities noted Neurological: Alert oriented x 4, grossly nonfocal Psychological:  Alert and cooperative. Normal mood and affect   Assessment and Recommendations:  Crohn's ileocolitis.  Symptoms of mild diarrhea and mild RLQ pain have improved.  Continue budesonide 9 mg daily.  He would like to remain on Entocort for now since his symptoms have improved and he is tolerating the medication without problems at this time.  Discuss other long-term management options at his next office appointment. REV in 6 months.

## 2020-11-18 ENCOUNTER — Other Ambulatory Visit: Payer: Self-pay

## 2020-11-18 ENCOUNTER — Other Ambulatory Visit: Payer: BC Managed Care – PPO

## 2020-11-18 DIAGNOSIS — Z13220 Encounter for screening for lipoid disorders: Secondary | ICD-10-CM

## 2020-11-18 DIAGNOSIS — Z125 Encounter for screening for malignant neoplasm of prostate: Secondary | ICD-10-CM

## 2020-11-18 DIAGNOSIS — Z0000 Encounter for general adult medical examination without abnormal findings: Secondary | ICD-10-CM

## 2020-11-18 LAB — LIPID PANEL
Cholesterol: 142 mg/dL (ref <200)
HDL: 47 mg/dL (ref >=40)
LDL Cholesterol (Calc): 75 mg/dL (calc)
Non-HDL Cholesterol (Calc): 95 mg/dL (calc) (ref <130)
Total CHOL/HDL Ratio: 3 (calc) (ref <5.0)
Triglycerides: 121 mg/dL (ref <150)

## 2020-11-18 LAB — COMPLETE METABOLIC PANEL WITH GFR
AG Ratio: 1.5 (calc) (ref 1.0–2.5)
ALT: 28 U/L (ref 9–46)
AST: 28 U/L (ref 10–35)
Albumin: 4.2 g/dL (ref 3.6–5.1)
Alkaline phosphatase (APISO): 44 U/L (ref 35–144)
BUN: 17 mg/dL (ref 7–25)
CO2: 30 mmol/L (ref 20–32)
Calcium: 9.7 mg/dL (ref 8.6–10.3)
Chloride: 101 mmol/L (ref 98–110)
Creat: 1.27 mg/dL (ref 0.70–1.35)
Globulin: 2.8 g/dL (calc) (ref 1.9–3.7)
Glucose, Bld: 107 mg/dL — ABNORMAL HIGH (ref 65–99)
Potassium: 4.6 mmol/L (ref 3.5–5.3)
Sodium: 139 mmol/L (ref 135–146)
Total Bilirubin: 0.9 mg/dL (ref 0.2–1.2)
Total Protein: 7 g/dL (ref 6.1–8.1)
eGFR: 64 mL/min/{1.73_m2} (ref >=60)

## 2020-11-18 LAB — CBC WITH DIFFERENTIAL/PLATELET
Absolute Monocytes: 1013 cells/uL — ABNORMAL HIGH (ref 200–950)
Basophils Absolute: 41 cells/uL (ref 0–200)
Basophils Relative: 0.3 %
Eosinophils Absolute: 108 cells/uL (ref 15–500)
Eosinophils Relative: 0.8 %
HCT: 46.5 % (ref 38.5–50.0)
Hemoglobin: 15.5 g/dL (ref 13.2–17.1)
Lymphs Abs: 2309 cells/uL (ref 850–3900)
MCH: 31 pg (ref 27.0–33.0)
MCHC: 33.3 g/dL (ref 32.0–36.0)
MCV: 93 fL (ref 80.0–100.0)
MPV: 12 fL (ref 7.5–12.5)
Monocytes Relative: 7.5 %
Neutro Abs: 10031 cells/uL — ABNORMAL HIGH (ref 1500–7800)
Neutrophils Relative %: 74.3 %
Platelets: 215 10*3/uL (ref 140–400)
RBC: 5 10*6/uL (ref 4.20–5.80)
RDW: 12.8 % (ref 11.0–15.0)
Total Lymphocyte: 17.1 %
WBC: 13.5 10*3/uL — ABNORMAL HIGH (ref 3.8–10.8)

## 2020-11-18 LAB — PSA: PSA: 1.85 ng/mL (ref <=4.00)

## 2020-11-24 ENCOUNTER — Encounter: Payer: Self-pay | Admitting: Family Medicine

## 2020-11-24 ENCOUNTER — Ambulatory Visit (INDEPENDENT_AMBULATORY_CARE_PROVIDER_SITE_OTHER): Payer: BC Managed Care – PPO | Admitting: Family Medicine

## 2020-11-24 ENCOUNTER — Other Ambulatory Visit: Payer: Self-pay

## 2020-11-24 VITALS — BP 138/84 | HR 90 | Temp 98.4°F | Resp 16 | Ht 70.0 in | Wt 242.0 lb

## 2020-11-24 DIAGNOSIS — Z0000 Encounter for general adult medical examination without abnormal findings: Secondary | ICD-10-CM

## 2020-11-24 MED ORDER — TADALAFIL 10 MG PO TABS: 10.0000 mg | ORAL_TABLET | Freq: Every day | ORAL | 0 refills | Status: DC | PRN

## 2020-11-24 NOTE — Progress Notes (Signed)
Subjective:    Patient ID: Victor Chandler, male    DOB: [birth year], 65 y.o.   MRN: [id number]  HPI  Patient is here today for complete physical exam.  Patient recently had dental work done and is on an antibiotic for an infected tooth.  This might explain his elevated white blood cell count below.  Otherwise he is doing well.  Had colonoscopy in April of this year.  Colonoscopy revealed 1 sessile polyp as well as some mild chronic active colitis.  Patient is established with a gastroenterologist.  He denies any chest pain shortness of breath or dyspnea on exertion however he does report feeling tired and sleepy all the time.  He can easily fall asleep watching TV or reading or while sitting after lunch.  He does report snoring.  We discussed a referral for sleep study but he refuses today. Lab on 11/18/2020  Component Date Value Ref Range Status   WBC 11/18/2020 13.5 (A) 3.8 - 10.8 Thousand/uL Final   RBC 11/18/2020 5.00  4.20 - 5.80 Million/uL Final   Hemoglobin 11/18/2020 15.5  13.2 - 17.1 g/dL Final   HCT 11/18/2020 46.5  38.5 - 50.0 % Final   MCV 11/18/2020 93.0  80.0 - 100.0 fL Final   MCH 11/18/2020 31.0  27.0 - 33.0 pg Final   MCHC 11/18/2020 33.3  32.0 - 36.0 g/dL Final   RDW 11/18/2020 12.8  11.0 - 15.0 % Final   Platelets 11/18/2020 215  140 - 400 Thousand/uL Final   MPV 11/18/2020 12.0  7.5 - 12.5 fL Final   Neutro Abs 11/18/2020 10,031 (A) 1,500 - 7,800 cells/uL Final   Lymphs Abs 11/18/2020 2,309  850 - 3,900 cells/uL Final   Absolute Monocytes 11/18/2020 1,013 (A) 200 - 950 cells/uL Final   Eosinophils Absolute 11/18/2020 108  15 - 500 cells/uL Final   Basophils Absolute 11/18/2020 41  0 - 200 cells/uL Final   Neutrophils Relative % 11/18/2020 74.3  % Final   Total Lymphocyte 11/18/2020 17.1  % Final   Monocytes Relative 11/18/2020 7.5  % Final   Eosinophils Relative 11/18/2020 0.8  % Final   Basophils Relative 11/18/2020 0.3  % Final   Glucose, Bld 11/18/2020 107 (A) 65  - 99 mg/dL Final   Comment: .            Fasting reference interval . For someone without known diabetes, a glucose value between 100 and 125 mg/dL is consistent with prediabetes and should be confirmed with a follow-up test. .    BUN 11/18/2020 17  7 - 25 mg/dL Final   Creat 11/18/2020 1.27  0.70 - 1.35 mg/dL Final   eGFR 11/18/2020 64  > OR = 60 mL/min/1.42m Final   Comment: The eGFR is based on the CKD-EPI 2021 equation. To calculate  the new eGFR from a previous Creatinine or Cystatin C result, go to https://www.kidney.org/professionals/ kdoqi/gfr%5Fcalculator    BUN/Creatinine Ratio 068/03/2122NOT APPLICABLE  6 - 22 (calc) Final   Sodium 11/18/2020 139  135 - 146 mmol/L Final   Potassium 11/18/2020 4.6  3.5 - 5.3 mmol/L Final   Chloride 11/18/2020 101  98 - 110 mmol/L Final   CO2 11/18/2020 30  20 - 32 mmol/L Final   Calcium 11/18/2020 9.7  8.6 - 10.3 mg/dL Final   Total Protein 11/18/2020 7.0  6.1 - 8.1 g/dL Final   Albumin 11/18/2020 4.2  3.6 - 5.1 g/dL Final   Globulin 11/18/2020 2.8  1.9 -  3.7 g/dL (calc) Final   AG Ratio 11/18/2020 1.5  1.0 - 2.5 (calc) Final   Total Bilirubin 11/18/2020 0.9  0.2 - 1.2 mg/dL Final   Alkaline phosphatase (APISO) 11/18/2020 44  35 - 144 U/L Final   AST 11/18/2020 28  10 - 35 U/L Final   ALT 11/18/2020 28  9 - 46 U/L Final   Cholesterol 11/18/2020 142  <200 mg/dL Final   HDL 11/18/2020 47  > OR = 40 mg/dL Final   Triglycerides 11/18/2020 121  <150 mg/dL Final   LDL Cholesterol (Calc) 11/18/2020 75  mg/dL (calc) Final   Comment: Reference range: <100 . Desirable range <100 mg/dL for primary prevention;   <70 mg/dL for patients with CHD or diabetic patients  with > or = 2 CHD risk factors. Marland Kitchen LDL-C is now calculated using the Martin-Hopkins  calculation, which is a validated novel method providing  better accuracy than the Friedewald equation in the  estimation of LDL-C.  Cresenciano Genre et al. Annamaria Helling. 6812;751(70): 2061-2068   (http://education.[facility].com/faq/FAQ164)    Total CHOL/HDL Ratio 11/18/2020 3.0  <5.0 (calc) Final   Non-HDL Cholesterol (Calc) 11/18/2020 95  <130 mg/dL (calc) Final   Comment: For patients with diabetes plus 1 major ASCVD risk  factor, treating to a non-HDL-C goal of <100 mg/dL  (LDL-C of <70 mg/dL) is considered a therapeutic  option.    PSA 11/18/2020 1.85  < OR = 4.00 ng/mL Final   Comment: The total PSA value from this assay system is  standardized against the WHO standard. The test  result will be approximately 20% lower when compared  to the equimolar-standardized total PSA (Beckman  Coulter). Comparison of serial PSA results should be  interpreted with this fact in mind. . This test was performed using the Siemens  chemiluminescent method. Values obtained from  different assay methods cannot be used interchangeably. PSA levels, regardless of value, should not be interpreted as absolute evidence of the presence or absence of disease.    Past Medical History:  Diagnosis Date   Allergy    Colitis    GERD (gastroesophageal reflux disease)    Hyperlipidemia    Current Outpatient Medications on File Prior to Visit  Medication Sig Dispense Refill   atorvastatin (LIPITOR) 10 MG tablet TAKE 1 TABLET BY MOUTH EVERY DAY 90 tablet 0   budesonide (ENTOCORT EC) 3 MG 24 hr capsule Take 3 capsules (9 mg total) by mouth daily. 270 capsule 3   cetirizine (ZYRTEC) 10 MG tablet Take 10 mg by mouth daily.     cholecalciferol (VITAMIN D) 1000 units tablet Take 1,000 Units by mouth daily.     esomeprazole (NEXIUM) 20 MG capsule Take 20 mg by mouth as needed.     fish oil-omega-3 fatty acids 1000 MG capsule Take 2 g by mouth daily.     fluticasone (FLONASE) 50 MCG/ACT nasal spray Place 2 sprays into both nostrils daily.     Multiple Vitamin (MULTIVITAMIN) tablet Take 1 tablet by mouth daily.     No current facility-administered medications on file prior to visit.   Allergies   Allergen Reactions   Codeine Nausea And Vomiting   Sulfa Antibiotics Rash   Social History   Socioeconomic History   Marital status: Married    Spouse name: Not on file   Number of children: Not on file   Years of education: Not on file   Highest education level: Not on file  Occupational History   Not on  file  Tobacco Use   Smoking status: Former   Smokeless tobacco: Former  Scientific laboratory technician Use: Never used  Substance and Sexual Activity   Alcohol use: Not Currently   Drug use: Never   Sexual activity: Not on file  Other Topics Concern   Not on file  Social History Narrative   Not on file   Social Determinants of Health   Financial Resource Strain: Not on file  Food Insecurity: Not on file  Transportation Needs: Not on file  Physical Activity: Not on file  Stress: Not on file  Social Connections: Not on file  Intimate Partner Violence: Not on file   Family History  Problem Relation Age of Onset   Colon polyps Neg Hx    Colon cancer Neg Hx    Esophageal cancer Neg Hx    Stomach cancer Neg Hx    Rectal cancer Neg Hx    Pancreatic cancer Neg Hx    Liver disease Neg Hx       Review of Systems  All other systems reviewed and are negative.     Objective:   Physical Exam Vitals reviewed.  Constitutional:      General: He is not in acute distress.    Appearance: He is well-developed. He is not diaphoretic.  HENT:     Head: Normocephalic and atraumatic.     Right Ear: External ear normal.     Left Ear: External ear normal.     Nose: Nose normal.     Mouth/Throat:     Pharynx: No oropharyngeal exudate.  Eyes:     General: No scleral icterus.       Right eye: No discharge.        Left eye: No discharge.     Conjunctiva/sclera: Conjunctivae normal.     Pupils: Pupils are equal, round, and reactive to light.  Neck:     Thyroid: No thyromegaly.     Vascular: No JVD.     Trachea: No tracheal deviation.  Cardiovascular:     Rate and Rhythm: Normal  rate and regular rhythm.     Heart sounds: Normal heart sounds. No murmur heard.   No friction rub. No gallop.  Pulmonary:     Effort: Pulmonary effort is normal. No respiratory distress.     Breath sounds: Normal breath sounds. No stridor. No wheezing or rales.  Chest:     Chest wall: No tenderness.  Abdominal:     General: Bowel sounds are normal. There is no distension.     Palpations: Abdomen is soft. There is no mass.     Tenderness: There is no abdominal tenderness. There is no guarding or rebound.  Genitourinary:    Penis: Normal.      Prostate: Normal.     Rectum: Normal.  Musculoskeletal:        General: No tenderness. Normal range of motion.     Cervical back: Normal range of motion and neck supple.  Lymphadenopathy:     Cervical: No cervical adenopathy.  Skin:    General: Skin is warm.     Coloration: Skin is not pale.     Findings: No erythema or rash.  Neurological:     Mental Status: He is alert and oriented to person, place, and time.     Cranial Nerves: No cranial nerve deficit.     Motor: No abnormal muscle tone.     Coordination: Coordination normal.     Deep Tendon  Reflexes: Reflexes are normal and symmetric. Reflexes normal.  Psychiatric:        Behavior: Behavior normal.        Thought Content: Thought content normal.        Judgment: Judgment normal.          Assessment & Plan:  General medical exam Cholesterol is outstanding.  Fasting blood sugar is mildly elevated.  I recommended a low carbohydrate diet and 15 to 20 pounds of weight loss.  Also increasing aerobic exercise would be beneficial.  Recommended a flu shot as well as a tetanus shot and a COVID booster.  He defers these at the present time.  Colonoscopy and PSA are up-to-date.  The remainder of his lab work is excellent.  Offered the patient a sleep study but he declined

## 2020-12-04 ENCOUNTER — Other Ambulatory Visit: Payer: Self-pay | Admitting: Family Medicine

## 2021-03-03 ENCOUNTER — Other Ambulatory Visit: Payer: Self-pay | Admitting: Family Medicine

## 2021-03-19 ENCOUNTER — Encounter: Payer: Self-pay | Admitting: Family Medicine

## 2021-03-19 ENCOUNTER — Other Ambulatory Visit: Payer: Self-pay

## 2021-03-19 ENCOUNTER — Ambulatory Visit (INDEPENDENT_AMBULATORY_CARE_PROVIDER_SITE_OTHER): Payer: BC Managed Care – PPO | Admitting: Family Medicine

## 2021-03-19 ENCOUNTER — Telehealth: Payer: Self-pay | Admitting: Family Medicine

## 2021-03-19 VITALS — BP 148/108 | HR 114 | Temp 99.1°F | Resp 18 | Ht 70.0 in | Wt 240.0 lb

## 2021-03-19 DIAGNOSIS — R1031 Right lower quadrant pain: Secondary | ICD-10-CM | POA: Diagnosis not present

## 2021-03-19 MED ORDER — ONDANSETRON HCL 4 MG PO TABS: 4.0000 mg | ORAL_TABLET | Freq: Three times a day (TID) | ORAL | 0 refills | Status: DC | PRN

## 2021-03-19 NOTE — Telephone Encounter (Signed)
Patient requesting for all prescriptions to be sent to [facility] moving forward. Pharmacy info is as follows:   [id number] - [place], [address] [facility]  [place], [place] [address]  Phone:  [phone]  Fax:  [phone]  DEA #:  [id number]  Please advise at [phone].

## 2021-03-19 NOTE — Progress Notes (Signed)
° °  Subjective:    Patient ID: Victor Chandler, male    DOB: Sep 28, 1955, 66 y.o.   MRN: [id number]  HPI  Tuesday night, the patient developed right lower quadrant abdominal pain.  He states that he had to vomit all throughout the night.  He was constantly waking up and having to the bathroom to vomit.  The emesis was nonbilious.  He denies any hematochezia or melena or hematemesis.  He vomited so hard that his abdominal wall muscles are sore even today.  He continues to have some mild discomfort in the right lower quadrant.  He has some tenderness to palpation in the right upper quadrant and the right lower quadrant and from the involuntary guarding along the right abdomen.  However he denies any fevers or chills.  He states that he is feeling better.  He denies any diarrhea or constipation.  Review of Systems     Objective:   Physical Exam Constitutional:      Appearance: He is well-developed. He is obese.  Eyes:     General: No scleral icterus. Cardiovascular:     Rate and Rhythm: Normal rate and regular rhythm.     Heart sounds: Normal heart sounds. No murmur heard.   No friction rub.  Abdominal:     General: Bowel sounds are decreased. There is no distension. There are no signs of injury.     Palpations: Abdomen is soft.     Tenderness: There is abdominal tenderness in the right upper quadrant and right lower quadrant. There is no guarding or rebound.  Neurological:     Mental Status: He is alert.    Patient has mild tenderness to palpation in the right upper quadrant and the right lower quadrant which he attributes to muscle tenderness from vomiting      Assessment & Plan:  Right lower quadrant abdominal pain - Plan: CT Abdomen Pelvis W Contrast Patient does not appear toxic today.  Differential diagnosis includes viral gastroenteritis, biliary tract disease/gallstones which has currently resolved, less likely would be an atypical appendicitis.  Patient does not appear acutely  ill and therefore I do not feel he needs to go to the emergency room.  We will treat the patient with clinical monitoring.  I recommended that he push fluids over the next day and rest.  He needs Zofran for nausea.  If pain is still present on Monday we will proceed with imaging of the abdomen and pelvis.  If it worsens over the weekend I have directed him to go immediately to the emergency room.

## 2021-03-19 NOTE — Telephone Encounter (Signed)
Noted and updated.  

## 2021-06-01 ENCOUNTER — Ambulatory Visit: Payer: BC Managed Care – PPO | Admitting: Family Medicine

## 2021-06-01 VITALS — BP 142/82 | HR 103 | Temp 96.0°F | Ht 70.0 in | Wt 241.2 lb

## 2021-06-01 DIAGNOSIS — J40 Bronchitis, not specified as acute or chronic: Secondary | ICD-10-CM | POA: Diagnosis not present

## 2021-06-01 MED ORDER — AZITHROMYCIN 250 MG PO TABS: ORAL_TABLET | ORAL | 0 refills | Status: DC

## 2021-06-01 MED ORDER — PREDNISONE 20 MG PO TABS: ORAL_TABLET | ORAL | 0 refills | Status: DC

## 2021-06-01 MED ORDER — ALBUTEROL SULFATE HFA 108 (90 BASE) MCG/ACT IN AERS: 2.0000 | INHALATION_SPRAY | Freq: Four times a day (QID) | RESPIRATORY_TRACT | 0 refills | Status: DC | PRN

## 2021-06-01 NOTE — Progress Notes (Signed)
? ?Subjective:  ? ? Patient ID: Victor Chandler, male    DOB: 1956-01-08, 66 y.o.   MRN: [id number] ? ?HPI ?Patient presents today complaining of feeling sick.  He started with a sinus infection last week around Thursday.  He was reporting pain and pressure in his frontal sinus area.  Over the last few days, it has "gotten into his chest".  He reports a cough productive of brown sputum.  He reports a fever over 100 last night.  This morning he is audibly wheezing.  He has rhonchorous breath sounds throughout.  Pulse oximetry is down to 93% on room air.  He denies any chest pain.  He denies any pleurisy.  He denies any hemoptysis.  He does states that he is getting more easily winded. ?Past Medical History:  ?Diagnosis Date  ? Allergy   ? Colitis   ? GERD (gastroesophageal reflux disease)   ? Hyperlipidemia   ? ?Past Surgical History:  ?Procedure Laterality Date  ? LUMBAR LAMINECTOMY  2019  ? VASECTOMY N/A   ? [employer] 09/28/2019  ? ?Current Outpatient Medications on File Prior to Visit  ?Medication Sig Dispense Refill  ? atorvastatin (LIPITOR) 10 MG tablet TAKE 1 TABLET BY MOUTH EVERY DAY 90 tablet 3  ? budesonide (ENTOCORT EC) 3 MG 24 hr capsule Take 3 capsules (9 mg total) by mouth daily. 270 capsule 3  ? cetirizine (ZYRTEC) 10 MG tablet Take 10 mg by mouth daily.    ? cholecalciferol (VITAMIN D) 1000 units tablet Take 1,000 Units by mouth daily.    ? esomeprazole (NEXIUM) 20 MG capsule Take 20 mg by mouth as needed.    ? fish oil-omega-3 fatty acids 1000 MG capsule Take 2 g by mouth daily.    ? fluticasone (FLONASE) 50 MCG/ACT nasal spray Place 2 sprays into both nostrils daily.    ? Multiple Vitamin (MULTIVITAMIN) tablet Take 1 tablet by mouth daily.    ? ondansetron (ZOFRAN) 4 MG tablet Take 1 tablet (4 mg total) by mouth every 8 (eight) hours as needed for nausea or vomiting. 20 tablet 0  ? tadalafil (CIALIS) 10 MG tablet Take 1 tablet (10 mg total) by mouth daily as needed for erectile dysfunction. 10 tablet 0   ? ?No current facility-administered medications on file prior to visit.  ? ?Allergies  ?Allergen Reactions  ? Codeine Nausea And Vomiting  ? Sulfa Antibiotics Rash  ? ?Social History  ? ?Socioeconomic History  ? Marital status: Married  ?  Spouse name: Not on file  ? Number of children: Not on file  ? Years of education: Not on file  ? Highest education level: Not on file  ?Occupational History  ? Not on file  ?Tobacco Use  ? Smoking status: Former  ? Smokeless tobacco: Former  ?Vaping Use  ? Vaping Use: Never used  ?Substance and Sexual Activity  ? Alcohol use: Not Currently  ? Drug use: Never  ? Sexual activity: Not on file  ?Other Topics Concern  ? Not on file  ?Social History Narrative  ? Not on file  ? ?Social Determinants of Health  ? ?Financial Resource Strain: Not on file  ?Food Insecurity: Not on file  ?Transportation Needs: Not on file  ?Physical Activity: Not on file  ?Stress: Not on file  ?Social Connections: Not on file  ?Intimate Partner Violence: Not on file  ? ? ? ?Review of Systems  ?All other systems reviewed and are negative. ? ?   ?Objective:  ?  Physical Exam ?Constitutional:   ?   General: He is not in acute distress. ?   Appearance: Normal appearance. He is well-developed. He is obese. He is not ill-appearing or toxic-appearing.  ?HENT:  ?   Right Ear: Tympanic membrane and ear canal normal.  ?   Left Ear: Tympanic membrane and ear canal normal.  ?   Nose: Congestion present.  ?   Mouth/Throat:  ?   Mouth: Mucous membranes are moist.  ?   Pharynx: No oropharyngeal exudate or posterior oropharyngeal erythema.  ?Eyes:  ?   General: No scleral icterus. ?Cardiovascular:  ?   Rate and Rhythm: Normal rate and regular rhythm.  ?   Heart sounds: Normal heart sounds. No murmur heard. ?  No friction rub.  ?Pulmonary:  ?   Breath sounds: No stridor. Wheezing and rhonchi present. No rales.  ?Abdominal:  ?   General: Bowel sounds are decreased. There is no distension. There are no signs of injury.  ?    Palpations: Abdomen is soft.  ?   Tenderness: There is no abdominal tenderness. There is no guarding or rebound.  ?Lymphadenopathy:  ?   Cervical: No cervical adenopathy.  ?Neurological:  ?   Mental Status: He is alert.  ? ? ?   ?Assessment & Plan:  ?Bronchitis ?Patient has bronchitis with bronchospasm.  Begin prednisone taper pack.  Use albuterol 2 puffs inhaled every 4-6 hours as needed.  Given the new fever and the purulent sputum I will also add a Z-Pak to cover possible bacterial bronchitis.  Reassess in 48 hours if no better or sooner if worse. ?

## 2021-11-22 ENCOUNTER — Other Ambulatory Visit: Payer: BC Managed Care – PPO

## 2021-11-22 DIAGNOSIS — Z0000 Encounter for general adult medical examination without abnormal findings: Secondary | ICD-10-CM

## 2021-11-22 DIAGNOSIS — Z136 Encounter for screening for cardiovascular disorders: Secondary | ICD-10-CM

## 2021-11-22 DIAGNOSIS — Z125 Encounter for screening for malignant neoplasm of prostate: Secondary | ICD-10-CM

## 2021-11-22 DIAGNOSIS — Z13220 Encounter for screening for lipoid disorders: Secondary | ICD-10-CM

## 2021-11-25 ENCOUNTER — Ambulatory Visit (INDEPENDENT_AMBULATORY_CARE_PROVIDER_SITE_OTHER): Payer: BC Managed Care – PPO | Admitting: Family Medicine

## 2021-11-25 VITALS — BP 132/86 | HR 93 | Wt 233.0 lb

## 2021-11-25 DIAGNOSIS — Z0000 Encounter for general adult medical examination without abnormal findings: Secondary | ICD-10-CM | POA: Diagnosis not present

## 2021-11-25 DIAGNOSIS — G4710 Hypersomnia, unspecified: Secondary | ICD-10-CM

## 2021-11-25 MED ORDER — SCOPOLAMINE 1 MG/3DAYS TD PT72: 1.0000 | MEDICATED_PATCH | TRANSDERMAL | 12 refills | Status: DC

## 2021-11-25 NOTE — Progress Notes (Signed)
Subjective:    Patient ID: Victor Chandler, male    DOB: December 04, 1955, 66 y.o.   MRN: [id number]  HPI  Patient is here today for complete physical exam.  Had colonoscopy in 4/22.  Colonoscopy revealed 1 sessile polyp as well as some mild chronic active colitis.  His gastroenterologist put him on the desonide for this.  Since that time his blood sugar has gone up and is now in the diabetic range.  He does report some polyuria.  PSA is also risen slightly compared to his baseline.  He denies any dysuria or urgency or frequency.  He declines a flu shot today.  He declines a COVID shot.  He does report hypersomnolence.  His wife states that he snores a lot and he always feels tired during the day.  We discussed this last year but he deferred a sleep study at that time.  He is also been getting cramps recently.  He also requested a scopolamine patch for an upcoming trip that he is planning  Lab on 11/22/2021  Component Date Value Ref Range Status   WBC 11/22/2021 9.7  3.8 - 10.8 Thousand/uL Final   RBC 11/22/2021 5.24  4.20 - 5.80 Million/uL Final   Hemoglobin 11/22/2021 16.2  13.2 - 17.1 g/dL Final   HCT 11/22/2021 49.0  38.5 - 50.0 % Final   MCV 11/22/2021 93.5  80.0 - 100.0 fL Final   MCH 11/22/2021 30.9  27.0 - 33.0 pg Final   MCHC 11/22/2021 33.1  32.0 - 36.0 g/dL Final   RDW 11/22/2021 12.3  11.0 - 15.0 % Final   Platelets 11/22/2021 241  140 - 400 Thousand/uL Final   MPV 11/22/2021 12.0  7.5 - 12.5 fL Final   Neutro Abs 11/22/2021 5,878  1,500 - 7,800 cells/uL Final   Lymphs Abs 11/22/2021 2,648  850 - 3,900 cells/uL Final   Absolute Monocytes 11/22/2021 980 (H)  200 - 950 cells/uL Final   Eosinophils Absolute 11/22/2021 136  15 - 500 cells/uL Final   Basophils Absolute 11/22/2021 58  0 - 200 cells/uL Final   Neutrophils Relative % 11/22/2021 60.6  % Final   Total Lymphocyte 11/22/2021 27.3  % Final   Monocytes Relative 11/22/2021 10.1  % Final   Eosinophils Relative 11/22/2021 1.4  %  Final   Basophils Relative 11/22/2021 0.6  % Final   Glucose, Bld 11/22/2021 126 (H)  65 - 99 mg/dL Final   Comment: .            Fasting reference interval . For someone without known diabetes, a glucose value >125 mg/dL indicates that they may have diabetes and this should be confirmed with a follow-up test. .    BUN 11/22/2021 16  7 - 25 mg/dL Final   Creat 11/22/2021 1.17  0.70 - 1.35 mg/dL Final   BUN/Creatinine Ratio 11/22/2021 SEE NOTE:  6 - 22 (calc) Final   Comment:    Not Reported: BUN and Creatinine are within    reference range. .    Sodium 11/22/2021 139  135 - 146 mmol/L Final   Potassium 11/22/2021 4.4  3.5 - 5.3 mmol/L Final   Chloride 11/22/2021 99  98 - 110 mmol/L Final   CO2 11/22/2021 27  20 - 32 mmol/L Final   Calcium 11/22/2021 9.5  8.6 - 10.3 mg/dL Final   Total Protein 11/22/2021 7.6  6.1 - 8.1 g/dL Final   Albumin 11/22/2021 4.5  3.6 - 5.1 g/dL Final  Globulin 11/22/2021 3.1  1.9 - 3.7 g/dL (calc) Final   AG Ratio 11/22/2021 1.5  1.0 - 2.5 (calc) Final   Total Bilirubin 11/22/2021 0.7  0.2 - 1.2 mg/dL Final   Alkaline phosphatase (APISO) 11/22/2021 53  35 - 144 U/L Final   AST 11/22/2021 33  10 - 35 U/L Final   ALT 11/22/2021 28  9 - 46 U/L Final   Cholesterol 11/22/2021 148  <200 mg/dL Final   HDL 11/22/2021 50  > OR = 40 mg/dL Final   Triglycerides 11/22/2021 148  <150 mg/dL Final   LDL Cholesterol (Calc) 11/22/2021 75  mg/dL (calc) Final   Comment: Reference range: <100 . Desirable range <100 mg/dL for primary prevention;   <70 mg/dL for patients with CHD or diabetic patients  with > or = 2 CHD risk factors. Marland Kitchen LDL-C is now calculated using the Martin-Hopkins  calculation, which is a validated novel method providing  better accuracy than the Friedewald equation in the  estimation of LDL-C.  Cresenciano Genre et al. Annamaria Helling. 1191;478(29): 2061-2068  (http://education.[facility].com/faq/FAQ164)    Total CHOL/HDL Ratio 11/22/2021 3.0  <5.0 (calc)  Final   Non-HDL Cholesterol (Calc) 11/22/2021 98  <130 mg/dL (calc) Final   Comment: For patients with diabetes plus 1 major ASCVD risk  factor, treating to a non-HDL-C goal of <100 mg/dL  (LDL-C of <70 mg/dL) is considered a therapeutic  option.    PSA 11/22/2021 2.79  < OR = 4.00 ng/mL Final   Comment: The total PSA value from this assay system is  standardized against the WHO standard. The test  result will be approximately 20% lower when compared  to the equimolar-standardized total PSA (Beckman  Coulter). Comparison of serial PSA results should be  interpreted with this fact in mind. . This test was performed using the Siemens  chemiluminescent method. Values obtained from  different assay methods cannot be used interchangeably. PSA levels, regardless of value, should not be interpreted as absolute evidence of the presence or absence of disease.    Past Medical History:  Diagnosis Date   Allergy    Colitis    GERD (gastroesophageal reflux disease)    Hyperlipidemia    Current Outpatient Medications on File Prior to Visit  Medication Sig Dispense Refill   albuterol (VENTOLIN HFA) 108 (90 Base) MCG/ACT inhaler Inhale 2 puffs into the lungs every 6 (six) hours as needed for wheezing or shortness of breath. 8 g 0   atorvastatin (LIPITOR) 10 MG tablet TAKE 1 TABLET BY MOUTH EVERY DAY 90 tablet 3   azithromycin (ZITHROMAX) 250 MG tablet 2 tabs poqday1, 1 tab poqday 2-5 6 tablet 0   budesonide (ENTOCORT EC) 3 MG 24 hr capsule Take 3 capsules (9 mg total) by mouth daily. 270 capsule 3   cetirizine (ZYRTEC) 10 MG tablet Take 10 mg by mouth daily.     cholecalciferol (VITAMIN D) 1000 units tablet Take 1,000 Units by mouth daily.     esomeprazole (NEXIUM) 20 MG capsule Take 20 mg by mouth as needed.     fish oil-omega-3 fatty acids 1000 MG capsule Take 2 g by mouth daily.     fluticasone (FLONASE) 50 MCG/ACT nasal spray Place 2 sprays into both nostrils daily.     Multiple Vitamin  (MULTIVITAMIN) tablet Take 1 tablet by mouth daily.     ondansetron (ZOFRAN) 4 MG tablet Take 1 tablet (4 mg total) by mouth every 8 (eight) hours as needed for nausea or vomiting. [place]  tablet 0   predniSONE (DELTASONE) 20 MG tablet 3 tabs poqday 1-2, 2 tabs poqday 3-4, 1 tab poqday 5-6 12 tablet 0   tadalafil (CIALIS) 10 MG tablet Take 1 tablet (10 mg total) by mouth daily as needed for erectile dysfunction. 10 tablet 0   No current facility-administered medications on file prior to visit.   Allergies  Allergen Reactions   Codeine Nausea And Vomiting   Sulfa Antibiotics Rash   Social History   Socioeconomic History   Marital status: Married    Spouse name: Not on file   Number of children: Not on file   Years of education: Not on file   Highest education level: Not on file  Occupational History   Not on file  Tobacco Use   Smoking status: Former   Smokeless tobacco: Former  Scientific laboratory technician Use: Never used  Substance and Sexual Activity   Alcohol use: Not Currently   Drug use: Never   Sexual activity: Not on file  Other Topics Concern   Not on file  Social History Narrative   Not on file   Social Determinants of Health   Financial Resource Strain: Not on file  Food Insecurity: Not on file  Transportation Needs: Not on file  Physical Activity: Not on file  Stress: Not on file  Social Connections: Not on file  Intimate Partner Violence: Not on file   Family History  Problem Relation Age of Onset   Colon polyps Neg Hx    Colon cancer Neg Hx    Esophageal cancer Neg Hx    Stomach cancer Neg Hx    Rectal cancer Neg Hx    Pancreatic cancer Neg Hx    Liver disease Neg Hx       Review of Systems  All other systems reviewed and are negative.      Objective:   Physical Exam Vitals reviewed.  Constitutional:      General: He is not in acute distress.    Appearance: He is well-developed. He is not diaphoretic.  HENT:     Head: Normocephalic and atraumatic.      Right Ear: External ear normal.     Left Ear: External ear normal.     Nose: Nose normal.     Mouth/Throat:     Pharynx: No oropharyngeal exudate.  Eyes:     General: No scleral icterus.       Right eye: No discharge.        Left eye: No discharge.     Conjunctiva/sclera: Conjunctivae normal.     Pupils: Pupils are equal, round, and reactive to light.  Neck:     Thyroid: No thyromegaly.     Vascular: No JVD.     Trachea: No tracheal deviation.  Cardiovascular:     Rate and Rhythm: Normal rate and regular rhythm.     Heart sounds: Normal heart sounds. No murmur heard.    No friction rub. No gallop.  Pulmonary:     Effort: Pulmonary effort is normal. No respiratory distress.     Breath sounds: Normal breath sounds. No stridor. No wheezing or rales.  Chest:     Chest wall: No tenderness.  Abdominal:     General: Bowel sounds are normal. There is no distension.     Palpations: Abdomen is soft. There is no mass.     Tenderness: There is no abdominal tenderness. There is no guarding or rebound.     Hernia: A  hernia is present.  Genitourinary:    Penis: Normal.      Prostate: Normal.     Rectum: Normal.  Musculoskeletal:        General: No tenderness. Normal range of motion.     Cervical back: Normal range of motion and neck supple.  Lymphadenopathy:     Cervical: No cervical adenopathy.  Skin:    General: Skin is warm.     Coloration: Skin is not pale.     Findings: No erythema or rash.  Neurological:     Mental Status: He is alert and oriented to person, place, and time.     Cranial Nerves: No cranial nerve deficit.     Motor: No abnormal muscle tone.     Coordination: Coordination normal.     Deep Tendon Reflexes: Reflexes are normal and symmetric. Reflexes normal.  Psychiatric:        Behavior: Behavior normal.        Thought Content: Thought content normal.        Judgment: Judgment normal.           Assessment & Plan:  Hypersomnolence - Plan: Home sleep  test  General medical exam I am concerned about his blood sugars, add a hemoglobin A1c.  I am also concerned about hypersomnolence and snoring so I would like to do a home sleep study to evaluate for.  I recommended diet exercise and weight loss.  The remainder of his lab work looks normal except for the rise in his PSA.  His digital rectal exam today however is normal I do not feel any nodularity.  Blood pressure today is excellent.  Recommended a flu shot, COVID shot, patient does have a small umbilical hernia that he is monitoring

## 2021-11-27 LAB — CBC WITH DIFFERENTIAL/PLATELET
Absolute Monocytes: 980 cells/uL — ABNORMAL HIGH (ref 200–950)
Basophils Absolute: 58 cells/uL (ref 0–200)
Basophils Relative: 0.6 %
Eosinophils Absolute: 136 cells/uL (ref 15–500)
Eosinophils Relative: 1.4 %
HCT: 49 % (ref 38.5–50.0)
Hemoglobin: 16.2 g/dL (ref 13.2–17.1)
Lymphs Abs: 2648 cells/uL (ref 850–3900)
MCH: 30.9 pg (ref 27.0–33.0)
MCHC: 33.1 g/dL (ref 32.0–36.0)
MCV: 93.5 fL (ref 80.0–100.0)
MPV: 12 fL (ref 7.5–12.5)
Monocytes Relative: 10.1 %
Neutro Abs: 5878 cells/uL (ref 1500–7800)
Neutrophils Relative %: 60.6 %
Platelets: 241 10*3/uL (ref 140–400)
RBC: 5.24 10*6/uL (ref 4.20–5.80)
RDW: 12.3 % (ref 11.0–15.0)
Total Lymphocyte: 27.3 %
WBC: 9.7 10*3/uL (ref 3.8–10.8)

## 2021-11-27 LAB — LIPID PANEL
Cholesterol: 148 mg/dL (ref <200)
HDL: 50 mg/dL (ref >=40)
LDL Cholesterol (Calc): 75 mg/dL (calc)
Non-HDL Cholesterol (Calc): 98 mg/dL (calc) (ref <130)
Total CHOL/HDL Ratio: 3 (calc) (ref <5.0)
Triglycerides: 148 mg/dL (ref <150)

## 2021-11-27 LAB — COMPREHENSIVE METABOLIC PANEL
AG Ratio: 1.5 (calc) (ref 1.0–2.5)
ALT: 28 U/L (ref 9–46)
AST: 33 U/L (ref 10–35)
Albumin: 4.5 g/dL (ref 3.6–5.1)
Alkaline phosphatase (APISO): 53 U/L (ref 35–144)
BUN: 16 mg/dL (ref 7–25)
CO2: 27 mmol/L (ref 20–32)
Calcium: 9.5 mg/dL (ref 8.6–10.3)
Chloride: 99 mmol/L (ref 98–110)
Creat: 1.17 mg/dL (ref 0.70–1.35)
Globulin: 3.1 g/dL (calc) (ref 1.9–3.7)
Glucose, Bld: 126 mg/dL — ABNORMAL HIGH (ref 65–99)
Potassium: 4.4 mmol/L (ref 3.5–5.3)
Sodium: 139 mmol/L (ref 135–146)
Total Bilirubin: 0.7 mg/dL (ref 0.2–1.2)
Total Protein: 7.6 g/dL (ref 6.1–8.1)

## 2021-11-27 LAB — TEST AUTHORIZATION

## 2021-11-27 LAB — HEMOGLOBIN A1C W/OUT EAG: Hgb A1c MFr Bld: 7.3 % of total Hgb — ABNORMAL HIGH (ref <5.7)

## 2021-11-27 LAB — PSA: PSA: 2.79 ng/mL (ref <=4.00)

## 2021-12-15 ENCOUNTER — Other Ambulatory Visit: Payer: Self-pay | Admitting: Gastroenterology

## 2021-12-16 ENCOUNTER — Encounter: Payer: BC Managed Care – PPO | Admitting: Gastroenterology

## 2022-01-17 ENCOUNTER — Ambulatory Visit (INDEPENDENT_AMBULATORY_CARE_PROVIDER_SITE_OTHER): Payer: BC Managed Care – PPO | Admitting: Physician Assistant

## 2022-01-17 ENCOUNTER — Encounter: Payer: Self-pay | Admitting: Physician Assistant

## 2022-01-17 VITALS — BP 126/68 | HR 84 | Ht 70.0 in | Wt 226.0 lb

## 2022-01-17 DIAGNOSIS — K5000 Crohn's disease of small intestine without complications: Secondary | ICD-10-CM

## 2022-01-17 MED ORDER — MESALAMINE 1.2 G PO TBEC: 2.4000 g | DELAYED_RELEASE_TABLET | Freq: Every day | ORAL | 5 refills | Status: DC

## 2022-01-17 NOTE — Progress Notes (Signed)
Chief Complaint: Follow-up terminal ileitis  HPI:    [name] is a 66 year old male with a past medical history as listed below including reflux and terminal ileitis, known to Dr. Fuller Plan, who presents to clinic today for follow-up of his ileitis.    4/22 colonoscopy with one 6 mm polyp in the ascending colon, few erosions of the ileocecal valve and several ulcers and erosions in the terminal ileum as well as internal hemorrhoids.  Biopsy showed chronic active ileitis and chronic mildly active colitis in the ileocecal valve as well as a sessile serrated polyp.  Repeat was recommended in 5 years.    07/24/2020 patient seen in clinic by Dr. Fuller Plan for follow-up after recent colonoscopy as above with chronic active ileitis.  He was started on Budesonide 9 mg daily.    10/23/2020 patient followed in clinic with Dr. Fuller Plan was continued on The desonide 9 mg daily as he was doing well.    Today, the patient tells me that he never really had any symptoms of "colitis", he does tell me that his stool was looser and thinner at times and he would have maybe 1-2 a day prior to being on the Budesonide and then became less frequent and slightly firmer with the Budesonide which he has been on 9 mg daily over the past year.  He tells me he ran out of this medication 2 weeks ago and stopped it.  Describes that he really did not feel well for about a week but he had gone out of town and thought it was related to this visit was his first plane trip etc., those symptoms resolved but his stool did return to a thinner looser stool still only 1-2 times a day.  He is currently not on medication and asked if there is something else to use because this is quite expensive for him.    Denies fever, chills, unintentional weight loss, blood in the stool, abdominal pain or heartburn or reflux.  Past Medical History:  Diagnosis Date   Allergy    Colitis    GERD (gastroesophageal reflux disease)    Hyperlipidemia     Past Surgical  History:  Procedure Laterality Date   LUMBAR LAMINECTOMY  2019   VASECTOMY N/A    [facility] 09/28/2019    Current Outpatient Medications  Medication Sig Dispense Refill   atorvastatin (LIPITOR) 10 MG tablet TAKE 1 TABLET BY MOUTH EVERY DAY 90 tablet 3   budesonide (ENTOCORT EC) 3 MG 24 hr capsule Take 3 capsules (9 mg total) by mouth daily. 270 capsule 3   cetirizine (ZYRTEC) 10 MG tablet Take 10 mg by mouth daily.     cholecalciferol (VITAMIN D) 1000 units tablet Take 1,000 Units by mouth daily.     esomeprazole (NEXIUM) 20 MG capsule Take 20 mg by mouth as needed.     fish oil-omega-3 fatty acids 1000 MG capsule Take 2 g by mouth daily.     fluticasone (FLONASE) 50 MCG/ACT nasal spray Place 2 sprays into both nostrils daily.     Multiple Vitamin (MULTIVITAMIN) tablet Take 1 tablet by mouth daily.     ondansetron (ZOFRAN) 4 MG tablet Take 1 tablet (4 mg total) by mouth every 8 (eight) hours as needed for nausea or vomiting. 20 tablet 0   scopolamine (TRANSDERM-SCOP) 1 MG/3DAYS Place 1 patch (1.5 mg total) onto the skin every 3 (three) days. 10 patch 12   tadalafil (CIALIS) 10 MG tablet Take 1 tablet (10 mg total)  by mouth daily as needed for erectile dysfunction. 10 tablet 0   albuterol (VENTOLIN HFA) 108 (90 Base) MCG/ACT inhaler Inhale 2 puffs into the lungs every 6 (six) hours as needed for wheezing or shortness of breath. (Patient not taking: Reported on 11/25/2021) 8 g 0   azithromycin (ZITHROMAX) 250 MG tablet 2 tabs poqday1, 1 tab poqday 2-5 (Patient not taking: Reported on 11/25/2021) 6 tablet 0   predniSONE (DELTASONE) 20 MG tablet 3 tabs poqday 1-2, 2 tabs poqday 3-4, 1 tab poqday 5-6 (Patient not taking: Reported on 11/25/2021) 12 tablet 0   No current facility-administered medications for this visit.    Allergies as of 01/17/2022 - Review Complete 01/17/2022  Allergen Reaction Noted   Codeine Nausea And Vomiting 12/04/2012   Sulfa antibiotics Rash 12/04/2012    Family  History  Problem Relation Age of Onset   Colon polyps Neg Hx    Colon cancer Neg Hx    Esophageal cancer Neg Hx    Stomach cancer Neg Hx    Rectal cancer Neg Hx    Pancreatic cancer Neg Hx    Liver disease Neg Hx     Social History   Socioeconomic History   Marital status: Married    Spouse name: Not on file   Number of children: Not on file   Years of education: Not on file   Highest education level: Not on file  Occupational History   Not on file  Tobacco Use   Smoking status: Former   Smokeless tobacco: Former  Scientific laboratory technician Use: Never used  Substance and Sexual Activity   Alcohol use: Not Currently   Drug use: Never   Sexual activity: Not on file  Other Topics Concern   Not on file  Social History Narrative   Not on file   Social Determinants of Health   Financial Resource Strain: Not on file  Food Insecurity: Not on file  Transportation Needs: Not on file  Physical Activity: Not on file  Stress: Not on file  Social Connections: Not on file  Intimate Partner Violence: Not on file    Review of Systems:    Constitutional: No weight loss, fever or chills Cardiovascular: No chest pain Respiratory: No SOB  Gastrointestinal: See HPI and otherwise negative   Physical Exam:  Vital signs: BP 126/68   Pulse 84   Ht '5\' 10"'$  (1.778 m)   Wt 226 lb (102.5 kg)   BMI 32.43 kg/m    Constitutional:   Pleasant Caucasian male appears to be in NAD, Well developed, Well nourished, alert and cooperative Respiratory: Respirations even and unlabored. Lungs clear to auscultation bilaterally.   No wheezes, crackles, or rhonchi.  Cardiovascular: Normal S1, S2. No MRG. Regular rate and rhythm. No peripheral edema, cyanosis or pallor.  Gastrointestinal:  Soft, nondistended, nontender. No rebound or guarding. Normal bowel sounds. No appreciable masses or hepatomegaly. Rectal:  Not performed.  Psychiatric: Oriented to person, place and time. Demonstrates good judgement and  reason without abnormal affect or behaviors.  RELEVANT LABS AND IMAGING: CBC    Component Value Date/Time   WBC 9.7 11/22/2021 0803   RBC 5.24 11/22/2021 0803   HGB 16.2 11/22/2021 0803   HCT 49.0 11/22/2021 0803   PLT 241 11/22/2021 0803   MCV 93.5 11/22/2021 0803   MCH 30.9 11/22/2021 0803   MCHC 33.1 11/22/2021 0803   RDW 12.3 11/22/2021 0803   LYMPHSABS 2,648 11/22/2021 0803   MONOABS 0.5 03/27/2015 3532  EOSABS 136 11/22/2021 0803   BASOSABS 58 11/22/2021 0803    CMP     Component Value Date/Time   NA 139 11/22/2021 0803   K 4.4 11/22/2021 0803   CL 99 11/22/2021 0803   CO2 27 11/22/2021 0803   GLUCOSE 126 (H) 11/22/2021 0803   BUN 16 11/22/2021 0803   CREATININE 1.17 11/22/2021 0803   CALCIUM 9.5 11/22/2021 0803   PROT 7.6 11/22/2021 0803   ALBUMIN 4.2 03/27/2015 0833   AST 33 11/22/2021 0803   ALT 28 11/22/2021 0803   ALKPHOS 55 03/27/2015 0833   BILITOT 0.7 11/22/2021 0803   GFRNONAA 66 09/04/2019 0819   GFRAA 76 09/04/2019 0819    Assessment: 1.  Crohn's ileitis: Diagnosed via colonoscopy in 2022, patient had resolution of his very mild symptoms on Budesonide 9 mg daily but ran out of the medicine 2 weeks ago and wants to try something lower cost  Plan: 1.  Recent CBC and CMP as above. 2.  Started the patient on Lialda 2.4 g (2 tabs) once a day.  #60 with 5 refills.  Discussed that if it does not seem to return him back to how he felt on the Budesonide then can increase dosage.  He has very mild symptoms. 3.  Did suggest that we repeat a colonoscopy at this point to see if he even needs to remain on medication but he does not want to do this.  He wants to wait until it is due again. 4.  Discussed that the reason that he felt so bad is probably because he stopped Budesonide abruptly which is not meant to be done. 5.  Patient to follow in clinic in 3 months with Dr. Fuller Plan.  At that point can discuss long-term medication plans versus repeat  colonoscopy.  Ellouise Newer, PA-C [facility] 01/17/2022, 2:52 PM  Cc: Susy Frizzle, MD

## 2022-01-17 NOTE — Patient Instructions (Addendum)
If you are age 64 or older, your body mass index should be between 23-30. Your Body mass index is 32.43 kg/m. If this is out of the aforementioned range listed, please consider follow up with your Primary Care Provider.  If you are age 64 or younger, your body mass index should be between 19-25. Your Body mass index is 32.43 kg/m. If this is out of the aformentioned range listed, please consider follow up with your Primary Care Provider.   ________________________________________________________  We have sent the following medications to your pharmacy for you to pick up at your convenience: Lialda 1.2 grams : Take 2 tablets at breakfast.   Please follow up with Dr. Fuller Plan in 3 months   Thank you for entrusting me with your care and for choosing [facility], Ellouise Newer, P.A. - C.

## 2022-02-16 ENCOUNTER — Encounter: Payer: Self-pay | Admitting: Family Medicine

## 2022-02-16 ENCOUNTER — Other Ambulatory Visit: Payer: Self-pay | Admitting: Family Medicine

## 2022-02-16 ENCOUNTER — Ambulatory Visit: Payer: BC Managed Care – PPO | Admitting: Family Medicine

## 2022-02-16 VITALS — BP 128/78 | HR 90 | Temp 98.6°F | Ht 70.0 in | Wt 219.0 lb

## 2022-02-16 DIAGNOSIS — J40 Bronchitis, not specified as acute or chronic: Secondary | ICD-10-CM | POA: Diagnosis not present

## 2022-02-16 MED ORDER — AZITHROMYCIN 250 MG PO TABS: ORAL_TABLET | ORAL | 0 refills | Status: AC

## 2022-02-16 MED ORDER — PREDNISONE 20 MG PO TABS: ORAL_TABLET | ORAL | 0 refills | Status: DC

## 2022-02-16 MED ORDER — ALBUTEROL SULFATE HFA 108 (90 BASE) MCG/ACT IN AERS: 2.0000 | INHALATION_SPRAY | Freq: Four times a day (QID) | RESPIRATORY_TRACT | 0 refills | Status: DC | PRN

## 2022-02-16 NOTE — Telephone Encounter (Signed)
Pls advice  

## 2022-02-16 NOTE — Progress Notes (Signed)
Acute Office Visit  Subjective:     Patient ID: Victor Chandler, male    DOB: 1955-04-29, 66 y.o.   MRN: [id number]  Chief Complaint  Patient presents with   Acute Visit    cough and congestion/possible bronchitis     HPI Patient is in today for cough with brown-yellow sputum, congestion, fever and wheezing for 4-5 days. The runny nose and congestion is improving with medications but overall it is moving into his chest.  Denies pleurisy, chest pain, sinus pressure, headache. Has tried Nyquil, Mucinex, Flonase No albuterol use, states he never has used it.  Review of Systems  All other systems reviewed and are negative.   Past Medical History:  Diagnosis Date   Allergy    Colitis    GERD (gastroesophageal reflux disease)    Hyperlipidemia    Past Surgical History:  Procedure Laterality Date   LUMBAR LAMINECTOMY  2019   VASECTOMY N/A    [employer] 09/28/2019   Current Outpatient Medications on File Prior to Visit  Medication Sig Dispense Refill   atorvastatin (LIPITOR) 10 MG tablet TAKE 1 TABLET BY MOUTH EVERY DAY 90 tablet 3   cetirizine (ZYRTEC) 10 MG tablet Take 10 mg by mouth daily.     cholecalciferol (VITAMIN D) 1000 units tablet Take 1,000 Units by mouth daily.     fish oil-omega-3 fatty acids 1000 MG capsule Take 2 g by mouth daily.     fluticasone (FLONASE) 50 MCG/ACT nasal spray Place 2 sprays into both nostrils daily.     mesalamine (LIALDA) 1.2 g EC tablet Take 2 tablets (2.4 g total) by mouth daily with breakfast. 60 tablet 5   Multiple Vitamin (MULTIVITAMIN) tablet Take 1 tablet by mouth daily.     ondansetron (ZOFRAN) 4 MG tablet Take 1 tablet (4 mg total) by mouth every 8 (eight) hours as needed for nausea or vomiting. 20 tablet 0   scopolamine (TRANSDERM-SCOP) 1 MG/3DAYS Place 1 patch (1.5 mg total) onto the skin every 3 (three) days. 10 patch 12   tadalafil (CIALIS) 10 MG tablet Take 1 tablet (10 mg total) by mouth daily as needed for erectile  dysfunction. 10 tablet 0   esomeprazole (NEXIUM) 20 MG capsule Take 20 mg by mouth as needed. (Patient not taking: Reported on 02/16/2022)     No current facility-administered medications on file prior to visit.   Allergies  Allergen Reactions   Codeine Nausea And Vomiting   Sulfa Antibiotics Rash       Objective:    BP 128/78   Pulse 90   Temp 98.6 F (37 C) (Oral)   Ht '5\' 10"'$  (1.778 m)   Wt 219 lb (99.3 kg)   SpO2 95%   BMI 31.42 kg/m    Physical Exam Vitals and nursing note reviewed.  Constitutional:      Appearance: Normal appearance. He is normal weight.  HENT:     Head: Normocephalic and atraumatic.     Nose: Congestion present.     Right Sinus: No maxillary sinus tenderness or frontal sinus tenderness.     Left Sinus: No maxillary sinus tenderness or frontal sinus tenderness.  Cardiovascular:     Rate and Rhythm: Normal rate and regular rhythm.     Pulses: Normal pulses.     Heart sounds: Normal heart sounds.  Pulmonary:     Effort: Pulmonary effort is normal.     Breath sounds: Examination of the right-upper field reveals wheezing and rhonchi. Examination of  the left-upper field reveals wheezing and rhonchi. Examination of the right-lower field reveals wheezing and rhonchi. Examination of the left-lower field reveals wheezing and rhonchi. Wheezing and rhonchi present.  Skin:    General: Skin is warm and dry.     Capillary Refill: Capillary refill takes less than 2 seconds.  Neurological:     General: No focal deficit present.     Mental Status: He is alert and oriented to person, place, and time. Mental status is at baseline.  Psychiatric:        Mood and Affect: Mood normal.        Behavior: Behavior normal.        Thought Content: Thought content normal.        Judgment: Judgment normal.     No results found for any visits on 02/16/22.      Assessment & Plan:   Problem List Items Addressed This Visit       Respiratory   Bronchitis - Primary     Patient symptoms consistent with bronchitis with bronchospasm. Given his fever and mucopurulent sputum will start Z-pack to cover for bacterial component in addition to prednisone taper. Encouraged Albuterol use PRN for wheezing and shortness of breath. Continue symptomatic management, rest, and push fluids. Return to office if symptoms persist or worsen.       Meds ordered this encounter  Medications   predniSONE (DELTASONE) 20 MG tablet    Sig: 3 tabs poqday 1-2, 2 tabs poqday 3-4, 1 tab poqday 5-6    Dispense:  12 tablet    Refill:  0    Order Specific Question:   Supervising Provider    Answer:   Jenna Luo T [[id number]]   azithromycin (ZITHROMAX) 250 MG tablet    Sig: Take 2 tablets on day 1, then 1 tablet daily on days 2 through 5    Dispense:  6 tablet    Refill:  0    Order Specific Question:   Supervising Provider    Answer:   Jenna Luo T [[id number]]   albuterol (VENTOLIN HFA) 108 (90 Base) MCG/ACT inhaler    Sig: Inhale 2 puffs into the lungs every 6 (six) hours as needed for wheezing or shortness of breath.    Dispense:  8 g    Refill:  0    Order Specific Question:   Supervising Provider    Answer:   Jenna Luo T [[id number]]    Return if symptoms worsen or fail to improve.  Rubie Maid, FNP

## 2022-02-16 NOTE — Assessment & Plan Note (Addendum)
Patient symptoms consistent with bronchitis with bronchospasm. Given his fever and mucopurulent sputum will start Z-pack to cover for bacterial component in addition to prednisone taper. Encouraged Albuterol use PRN for wheezing and shortness of breath. Continue symptomatic management, rest, and push fluids. Return to office if symptoms persist or worsen.

## 2022-02-17 ENCOUNTER — Other Ambulatory Visit: Payer: Self-pay | Admitting: Family Medicine

## 2022-02-17 MED ORDER — ALBUTEROL SULFATE HFA 108 (90 BASE) MCG/ACT IN AERS: 2.0000 | INHALATION_SPRAY | Freq: Four times a day (QID) | RESPIRATORY_TRACT | 5 refills | Status: DC | PRN

## 2022-02-17 NOTE — Telephone Encounter (Signed)
Requested medication (s) are due for refill today: Alternative Requested.   Requested medication (s) are on the active medication list: yes  Last refill:  02/17/22  Future visit scheduled: yes  Notes to clinic:  Alternative Requested.      Requested Prescriptions  Pending Prescriptions Disp Refills   levalbuterol (XOPENEX HFA) 45 MCG/ACT inhaler [Pharmacy Med Name: LEVALBUTEROL TAR HFA 45MCG INH] 1 each 0    Sig: USE AS DIRECTED     Pulmonology:  Beta Agonists 2 Passed - 02/17/2022  9:17 AM      Passed - Last BP in normal range    BP Readings from Last 1 Encounters:  02/16/22 128/78         Passed - Last Heart Rate in normal range    Pulse Readings from Last 1 Encounters:  02/16/22 90         Passed - Valid encounter within last 12 months    Recent Outpatient Visits           8 months ago [place] Susy Frizzle, MD   11 months ago Right lower quadrant abdominal pain   [place] [facility] Schaumann, Cammie Mcgee, MD   1 year ago General medical exam   [place] Susy Frizzle, MD   2 years ago Colon cancer screening   [place] Denn[facility] Schaumann Cammie Mcgee, MD   3 years ago General medical exam   [place] Pickard, Cammie Mcgee, MD

## 2022-02-22 ENCOUNTER — Telehealth: Payer: Self-pay | Admitting: Family Medicine

## 2022-02-22 ENCOUNTER — Telehealth: Payer: BC Managed Care – PPO | Admitting: Family Medicine

## 2022-02-22 NOTE — Telephone Encounter (Signed)
Patient called stating his congestion has now went to his head and he is worried that it is going back to his chest. He wants to know if [facility] can [facility] [address] He said that at the last visit Amber had said she might have to call in something else.   CB# [phone]

## 2022-02-24 ENCOUNTER — Encounter: Payer: Self-pay | Admitting: Family Medicine

## 2022-02-24 ENCOUNTER — Ambulatory Visit: Payer: BC Managed Care – PPO | Admitting: Family Medicine

## 2022-02-24 ENCOUNTER — Telehealth: Payer: BC Managed Care – PPO | Admitting: Family Medicine

## 2022-02-24 VITALS — BP 138/86 | HR 80 | Temp 99.0°F | Ht 70.0 in | Wt 223.2 lb

## 2022-02-24 DIAGNOSIS — E119 Type 2 diabetes mellitus without complications: Secondary | ICD-10-CM | POA: Diagnosis not present

## 2022-02-24 DIAGNOSIS — J329 Chronic sinusitis, unspecified: Secondary | ICD-10-CM

## 2022-02-24 MED ORDER — AMOXICILLIN-POT CLAVULANATE 875-125 MG PO TABS: 1.0000 | ORAL_TABLET | Freq: Two times a day (BID) | ORAL | 0 refills | Status: DC

## 2022-02-24 NOTE — Progress Notes (Signed)
Subjective:    Patient ID: Victor Chandler, male    DOB: 1956-02-21, 66 y.o.   MRN: [id number]  HPI Patient was seen by my partner last week for bronchitis and was given a Z-Pak and prednisone.  Coughing and wheezing have improved dramatically.  His lungs are clear to auscultation however he reports bilateral frontal and maxillary sinus pain and pressure.  He would also like to recheck his blood sugar as he has lost weight and is working on his diet Past Medical History:  Diagnosis Date  . Allergy   . Colitis   . GERD (gastroesophageal reflux disease)   . Hyperlipidemia    Past Surgical History:  Procedure Laterality Date  . LUMBAR LAMINECTOMY  2019  . VASECTOMY N/A    [employer] 09/28/2019   Current Outpatient Medications on File Prior to Visit  Medication Sig Dispense Refill  . atorvastatin (LIPITOR) 10 MG tablet TAKE 1 TABLET BY MOUTH EVERY DAY 90 tablet 3  . cetirizine (ZYRTEC) 10 MG tablet Take 10 mg by mouth daily.    . cholecalciferol (VITAMIN D) 1000 units tablet Take 1,000 Units by mouth daily.    . fish oil-omega-3 fatty acids 1000 MG capsule Take 2 g by mouth daily.    . fluticasone (FLONASE) 50 MCG/ACT nasal spray Place 2 sprays into both nostrils daily.    . mesalamine (LIALDA) 1.2 g EC tablet Take 2 tablets (2.4 g total) by mouth daily with breakfast. 60 tablet 5  . Multiple Vitamin (MULTIVITAMIN) tablet Take 1 tablet by mouth daily.    . ondansetron (ZOFRAN) 4 MG tablet Take 1 tablet (4 mg total) by mouth every 8 (eight) hours as needed for nausea or vomiting. 20 tablet 0  . scopolamine (TRANSDERM-SCOP) 1 MG/3DAYS Place 1 patch (1.5 mg total) onto the skin every 3 (three) days. 10 patch 12  . tadalafil (CIALIS) 10 MG tablet Take 1 tablet (10 mg total) by mouth daily as needed for erectile dysfunction. 10 tablet 0   No current facility-administered medications on file prior to visit.   Allergies  Allergen Reactions  . Codeine Nausea And Vomiting  . Sulfa Antibiotics  Rash   Social History   Socioeconomic History  . Marital status: Married    Spouse name: Not on file  . Number of children: Not on file  . Years of education: Not on file  . Highest education level: Not on file  Occupational History  . Not on file  Tobacco Use  . Smoking status: Former  . Smokeless tobacco: Former  Media planner  . Vaping Use: Never used  Substance and Sexual Activity  . Alcohol use: Not Currently  . Drug use: Never  . Sexual activity: Not on file  Other Topics Concern  . Not on file  Social History Narrative  . Not on file   Social Determinants of Health   Financial Resource Strain: Not on file  Food Insecurity: Not on file  Transportation Needs: Not on file  Physical Activity: Not on file  Stress: Not on file  Social Connections: Not on file  Intimate Partner Violence: Not on file     Review of Systems  All other systems reviewed and are negative.      Objective:   Physical Exam Constitutional:      General: He is not in acute distress.    Appearance: Normal appearance. He is well-developed. He is obese. He is not ill-appearing or toxic-appearing.  HENT:  Right Ear: Tympanic membrane and ear canal normal.     Left Ear: Tympanic membrane and ear canal normal.     Nose: Congestion and rhinorrhea present.     Right Sinus: Frontal sinus tenderness present.     Left Sinus: Frontal sinus tenderness present.     Mouth/Throat:     Mouth: Mucous membranes are moist.     Pharynx: No oropharyngeal exudate or posterior oropharyngeal erythema.  Eyes:     General: No scleral icterus. Cardiovascular:     Rate and Rhythm: Normal rate and regular rhythm.     Heart sounds: Normal heart sounds. No murmur heard.    No friction rub.  Pulmonary:     Breath sounds: No stridor. No wheezing, rhonchi or rales.  Abdominal:     General: Bowel sounds are normal. There is no distension. There are no signs of injury.     Palpations: Abdomen is soft.      Tenderness: There is no abdominal tenderness. There is no guarding or rebound.  Lymphadenopathy:     Cervical: No cervical adenopathy.  Neurological:     Mental Status: He is alert.       Assessment & Plan:  Controlled type 2 diabetes mellitus without complication, without long-term current use of insulin (HCC) - Plan: Hemoglobin T2P, BASIC METABOLIC PANEL WITH GFR  Rhinosinusitis Add Augmentin to cover sinusitis.  875 mg twice daily for 10 days.  Meanwhile check BMP and A1c

## 2022-02-25 LAB — BASIC METABOLIC PANEL WITH GFR
BUN: 23 mg/dL (ref 7–25)
CO2: 27 mmol/L (ref 20–32)
Calcium: 9 mg/dL (ref 8.6–10.3)
Chloride: 104 mmol/L (ref 98–110)
Creat: 1.2 mg/dL (ref 0.70–1.35)
Glucose, Bld: 140 mg/dL — ABNORMAL HIGH (ref 65–99)
Potassium: 4 mmol/L (ref 3.5–5.3)
Sodium: 140 mmol/L (ref 135–146)
eGFR: 68 mL/min/{1.73_m2} (ref >=60)

## 2022-02-25 LAB — HEMOGLOBIN A1C
Hgb A1c MFr Bld: 6.4 % of total Hgb — ABNORMAL HIGH (ref <5.7)
Mean Plasma Glucose: 137 mg/dL
eAG (mmol/L): 7.6 mmol/L

## 2022-03-16 ENCOUNTER — Telehealth: Payer: Self-pay | Admitting: Family Medicine

## 2022-03-16 ENCOUNTER — Other Ambulatory Visit: Payer: Self-pay

## 2022-03-16 DIAGNOSIS — E7800 Pure hypercholesterolemia, unspecified: Secondary | ICD-10-CM

## 2022-03-16 MED ORDER — ATORVASTATIN CALCIUM 10 MG PO TABS: 10.0000 mg | ORAL_TABLET | Freq: Every day | ORAL | 1 refills | Status: DC

## 2022-03-16 NOTE — Telephone Encounter (Signed)
Prescription Request  03/16/2022  Is this a "Controlled Substance" medicine? No  LOV: 02/24/2022  What is the name of the medication or equipment?   atorvastatin (LIPITOR) 10 MG tablet     Have you contacted your pharmacy to request a refill? Yes   Which pharmacy would you like this sent to?  [id number] [place] [address] [address]Phone: [phone]: [phone]   Patient notified that their request is being sent to the clinical staff for review and that they should receive a response within 2 business days.   Please advise pharmacist at [phone]

## 2022-03-25 ENCOUNTER — Encounter: Payer: Self-pay | Admitting: Gastroenterology

## 2022-05-03 ENCOUNTER — Other Ambulatory Visit (INDEPENDENT_AMBULATORY_CARE_PROVIDER_SITE_OTHER): Payer: BC Managed Care – PPO

## 2022-05-03 ENCOUNTER — Ambulatory Visit: Payer: BC Managed Care – PPO | Admitting: Gastroenterology

## 2022-05-03 ENCOUNTER — Encounter: Payer: Self-pay | Admitting: Gastroenterology

## 2022-05-03 VITALS — BP 124/70 | HR 85 | Ht 70.0 in | Wt 227.0 lb

## 2022-05-03 DIAGNOSIS — K5000 Crohn's disease of small intestine without complications: Secondary | ICD-10-CM

## 2022-05-03 LAB — CBC WITH DIFFERENTIAL/PLATELET
Basophils Absolute: 0.1 10*3/uL (ref 0.0–0.1)
Basophils Relative: 0.7 % (ref 0.0–3.0)
Eosinophils Absolute: 0.4 10*3/uL (ref 0.0–0.7)
Eosinophils Relative: 4.3 % (ref 0.0–5.0)
HCT: 43.6 % (ref 39.0–52.0)
Hemoglobin: 14.7 g/dL (ref 13.0–17.0)
Lymphocytes Relative: 18.7 % (ref 12.0–46.0)
Lymphs Abs: 1.9 10*3/uL (ref 0.7–4.0)
MCHC: 33.6 g/dL (ref 30.0–36.0)
MCV: 90.9 fl (ref 78.0–100.0)
Monocytes Absolute: 1 10*3/uL (ref 0.1–1.0)
Monocytes Relative: 9.7 % (ref 3.0–12.0)
Neutro Abs: 6.7 10*3/uL (ref 1.4–7.7)
Neutrophils Relative %: 66.6 % (ref 43.0–77.0)
Platelets: 226 10*3/uL (ref 150.0–400.0)
RBC: 4.79 Mil/uL (ref 4.22–5.81)
RDW: 13.6 % (ref 11.5–15.5)
WBC: 10 10*3/uL (ref 4.0–10.5)

## 2022-05-03 LAB — COMPREHENSIVE METABOLIC PANEL
ALT: 17 U/L (ref 0–53)
AST: 25 U/L (ref 0–37)
Albumin: 4.3 g/dL (ref 3.5–5.2)
Alkaline Phosphatase: 59 U/L (ref 39–117)
BUN: 24 mg/dL — ABNORMAL HIGH (ref 6–23)
CO2: 23 mEq/L (ref 19–32)
Calcium: 9.5 mg/dL (ref 8.4–10.5)
Chloride: 105 mEq/L (ref 96–112)
Creatinine, Ser: 1.11 mg/dL (ref 0.40–1.50)
GFR: 70.24 mL/min (ref >60.00)
Glucose, Bld: 84 mg/dL (ref 70–99)
Potassium: 3.8 mEq/L (ref 3.5–5.1)
Sodium: 139 mEq/L (ref 135–145)
Total Bilirubin: 0.5 mg/dL (ref 0.2–1.2)
Total Protein: 7.3 g/dL (ref 6.0–8.3)

## 2022-05-03 LAB — HIGH SENSITIVITY CRP: CRP, High Sensitivity: 1.75 mg/L (ref 0.000–5.000)

## 2022-05-03 LAB — HCG, QUANTITATIVE, PREGNANCY: Quantitative HCG: 0.64 m[IU]/mL

## 2022-05-03 LAB — SEDIMENTATION RATE: Sed Rate: 23 mm/hr — ABNORMAL HIGH (ref 0–20)

## 2022-05-03 MED ORDER — MESALAMINE 1.2 G PO TBEC: 2.4000 g | DELAYED_RELEASE_TABLET | Freq: Every day | ORAL | 3 refills | Status: DC

## 2022-05-03 NOTE — Progress Notes (Signed)
    Assessment     Crohn's ileitis, symptoms controlled DM   Recommendations    CBC, CMP, ESR, CRP, TSH Continue Lialda 2.4 g qd REV in 1 year   HPI    This is a 67 year old male with Crohn's ileitis. See 01/17/2022 office note where he was changed from budesonide to Lialda. He notes occasional mild LLQ pain. No diarrhea, bleeding or weight loss. Feels well overall.    Labs / Imaging       Latest Ref Rng & Units 11/22/2021    8:03 AM 11/18/2020    8:33 AM 09/04/2019    8:19 AM  Hepatic Function  Total Protein 6.1 - 8.1 g/dL 7.6  7.0  7.2   AST 10 - 35 U/L 33  28  31   ALT 9 - 46 U/L '28  28  29   '$ Total Bilirubin 0.2 - 1.2 mg/dL 0.7  0.9  0.8        Latest Ref Rng & Units 11/22/2021    8:03 AM 11/18/2020    8:33 AM 09/04/2019    8:19 AM  CBC  WBC 3.8 - 10.8 Thousand/uL 9.7  13.5  7.9   Hemoglobin 13.2 - 17.1 g/dL 16.2  15.5  15.6   Hematocrit 38.5 - 50.0 % 49.0  46.5  46.5   Platelets 140 - 400 Thousand/uL 241  215  229    Current Medications, Allergies, Past Medical History, Past Surgical History, Family History and Social History were reviewed in [employer] record.   Physical Exam: General: Well developed, well nourished, no acute distress Head: Normocephalic and atraumatic Eyes: Sclerae anicteric, EOMI Ears: Normal auditory acuity Mouth: No deformities or lesions noted Lungs: Clear throughout to auscultation Heart: Regular rate and rhythm; No murmurs, rubs or bruits Abdomen: Soft, non tender and non distended. No masses, hepatosplenomegaly or hernias noted. Normal Bowel sounds Rectal: Not done Musculoskeletal: Symmetrical with no gross deformities  Pulses:  Normal pulses noted Extremities: No edema or deformities noted Neurological: Alert oriented x 4, grossly nonfocal Psychological:  Alert and cooperative. Normal mood and affect   Savera Donson T. Fuller Plan, MD 05/03/2022, 2:37 PM

## 2022-05-03 NOTE — Patient Instructions (Addendum)
Please follow up in year.  Your provider has requested that you go to the basement level for lab work before leaving today. Press "B" on the elevator. The lab is located at the first door on the left as you exit the elevator.  We have sent the following medications to your pharmacy for you to pick up at your convenience: Lialda: 2.4g   _______________________________________________________  If your blood pressure at your visit was 140/90 or greater, please contact your primary care physician to follow up on this.  _______________________________________________________  If you are age 65 or older, your body mass index should be between 23-30. Your Body mass index is 32.57 kg/m. If this is out of the aforementioned range listed, please consider follow up with your Primary Care Provider.  If you are age 72 or younger, your body mass index should be between 19-25. Your Body mass index is 32.57 kg/m. If this is out of the aformentioned range listed, please consider follow up with your Primary Care Provider.   ________________________________________________________  The [facility] providers would like to encourage you to use [facility] to communicate with providers for non-urgent requests or questions.  Due to long hold times on the telephone, sending your provider a message by [facility] may be a faster and more efficient way to get a response.  Please allow 48 business hours for a response.  Please remember that this is for non-urgent requests.  _______________________________________________________ It was a pleasure to see you today!  Thank you for trusting me with your gastrointestinal care!

## 2022-05-04 ENCOUNTER — Other Ambulatory Visit: Payer: Self-pay

## 2022-05-04 ENCOUNTER — Other Ambulatory Visit (INDEPENDENT_AMBULATORY_CARE_PROVIDER_SITE_OTHER): Payer: BC Managed Care – PPO

## 2022-05-04 DIAGNOSIS — K5090 Crohn's disease, unspecified, without complications: Secondary | ICD-10-CM | POA: Insufficient documentation

## 2022-05-04 DIAGNOSIS — K5000 Crohn's disease of small intestine without complications: Secondary | ICD-10-CM

## 2022-05-04 LAB — TSH: TSH: 2.54 u[IU]/mL (ref 0.35–5.50)

## 2022-07-10 ENCOUNTER — Other Ambulatory Visit: Payer: Self-pay | Admitting: Family Medicine

## 2022-07-10 DIAGNOSIS — E7800 Pure hypercholesterolemia, unspecified: Secondary | ICD-10-CM

## 2022-07-11 NOTE — Telephone Encounter (Signed)
Unable to refill per protocol, Rx request is too soon. Last refill 03/16/22 for 90 and 1 refill.  Requested Prescriptions  Pending Prescriptions Disp Refills   atorvastatin (LIPITOR) 10 MG tablet [Pharmacy Med Name: ATORVASTATIN 10 MG TABLET] 90 tablet 1    Sig: TAKE 1 TABLET BY MOUTH EVERY DAY     Cardiovascular:  Antilipid - Statins Failed - 07/10/2022  3:50 PM      Failed - Valid encounter within last 12 months    Recent Outpatient Visits           1 year ago Bronchitis   [facility] Pickard, Priscille Heidelberg, MD   1 year ago Right lower quadrant abdominal pain   [facility] Pickard, Priscille Heidelberg, MD   1 year ago General medical exam   [facility] Donita Brooks, MD   2 years ago Colon cancer screening   [facility] Tanya Nones, Priscille Heidelberg, MD   3 years ago General medical exam   [facility] Donita Brooks, MD              Failed - Lipid Panel in normal range within the last 12 months    Cholesterol  Date Value Ref Range Status  11/22/2021 148 <200 mg/dL Final   LDL Cholesterol (Calc)  Date Value Ref Range Status  11/22/2021 75 mg/dL (calc) Final    Comment:    Reference range: <100 . Desirable range <100 mg/dL for primary prevention;   <70 mg/dL for patients with CHD or diabetic patients  with > or = 2 CHD risk factors. Marland Kitchen LDL-C is now calculated using the Martin-Hopkins  calculation, which is a validated novel method providing  better accuracy than the Friedewald equation in the  estimation of LDL-C.  Horald Pollen et al. Lenox Ahr. 6045;409(81): 2061-2068  (http://education.[facility].com/faq/FAQ164)    HDL  Date Value Ref Range Status  11/22/2021 50 > OR = 40 mg/dL Final   Triglycerides  Date Value Ref Range Status  11/22/2021 148 <150 mg/dL Final         Passed - Patient is not pregnant

## 2022-09-16 ENCOUNTER — Other Ambulatory Visit: Payer: Self-pay | Admitting: Family Medicine

## 2022-09-16 DIAGNOSIS — E7800 Pure hypercholesterolemia, unspecified: Secondary | ICD-10-CM

## 2022-09-19 NOTE — Telephone Encounter (Signed)
OV 02/24/22- last lipid 11/22/21 Requested Prescriptions  Pending Prescriptions Disp Refills   atorvastatin (LIPITOR) 10 MG tablet [Pharmacy Med Name: ATORVASTATIN 10 MG TABLET] 90 tablet 1    Sig: TAKE 1 TABLET BY MOUTH EVERY DAY     Cardiovascular:  Antilipid - Statins Failed - 09/16/2022  7:59 PM      Failed - Valid encounter within last 12 months    Recent Outpatient Visits           1 year ago Bronchitis   [facility] Donita Brooks, MD   1 year ago Right lower quadrant abdominal pain   [facility] Pickard, Priscille Heidelberg, MD   1 year ago General medical exam   [facility] Donita Brooks, MD   2 years ago Colon cancer screening   [facility] Tanya Nones, Priscille Heidelberg, MD   4 years ago General medical exam   [facility] Donita Brooks, MD              Failed - Lipid Panel in normal range within the last 12 months    Cholesterol  Date Value Ref Range Status  11/22/2021 148 <200 mg/dL Final   LDL Cholesterol (Calc)  Date Value Ref Range Status  11/22/2021 75 mg/dL (calc) Final    Comment:    Reference range: <100 . Desirable range <100 mg/dL for primary prevention;   <70 mg/dL for patients with CHD or diabetic patients  with > or = 2 CHD risk factors. Marland Kitchen LDL-C is now calculated using the Martin-Hopkins  calculation, which is a validated novel method providing  better accuracy than the Friedewald equation in the  estimation of LDL-C.  Horald Pollen et al. Lenox Ahr. 7322;025(42): 2061-2068  (http://education.[facility].com/faq/FAQ164)    HDL  Date Value Ref Range Status  11/22/2021 50 > OR = 40 mg/dL Final   Triglycerides  Date Value Ref Range Status  11/22/2021 148 <150 mg/dL Final         Passed - Patient is not pregnant

## 2022-11-25 ENCOUNTER — Other Ambulatory Visit: Payer: BC Managed Care – PPO

## 2022-11-25 DIAGNOSIS — E7800 Pure hypercholesterolemia, unspecified: Secondary | ICD-10-CM

## 2022-11-25 DIAGNOSIS — E119 Type 2 diabetes mellitus without complications: Secondary | ICD-10-CM

## 2022-11-25 DIAGNOSIS — G4710 Hypersomnia, unspecified: Secondary | ICD-10-CM

## 2022-11-25 DIAGNOSIS — K50919 Crohn's disease, unspecified, with unspecified complications: Secondary | ICD-10-CM

## 2022-11-29 ENCOUNTER — Encounter: Payer: Self-pay | Admitting: Family Medicine

## 2022-11-29 ENCOUNTER — Ambulatory Visit: Payer: BC Managed Care – PPO | Admitting: Family Medicine

## 2022-11-29 VITALS — BP 128/70 | HR 81 | Temp 98.7°F | Ht 70.0 in | Wt 225.0 lb

## 2022-11-29 DIAGNOSIS — Z0000 Encounter for general adult medical examination without abnormal findings: Secondary | ICD-10-CM

## 2022-11-29 DIAGNOSIS — Z23 Encounter for immunization: Secondary | ICD-10-CM | POA: Diagnosis not present

## 2022-11-29 DIAGNOSIS — E119 Type 2 diabetes mellitus without complications: Secondary | ICD-10-CM

## 2022-11-29 NOTE — Progress Notes (Signed)
Subjective:    Patient ID: Victor Chandler, male    DOB: Jul 28, 1955, 67 y.o.   MRN: [id number]  HPI Patient is a 67 year old Caucasian gentleman who presents today for complete physical exam.  His last colonoscopy was in 2022.  Due to the presence of a tubular adenoma they recommended repeating this in 5 years.  He is due for a PSA today.  That is pending at the time of this dictation.  He is due for a flu shot, 3, AND A TETANUS SHOT.  HE AGREES TO RECEIVE A TETANUS SHOT TODAY BUT HE DECLINES THE OTHER VACCINES. Past Medical History:  Diagnosis Date   Allergy    Colitis    GERD (gastroesophageal reflux disease)    Hyperlipidemia    Prediabetes    Past Surgical History:  Procedure Laterality Date   LUMBAR LAMINECTOMY  2019   VASECTOMY N/A    [employer] 09/28/2019   Current Outpatient Medications on File Prior to Visit  Medication Sig Dispense Refill   atorvastatin (LIPITOR) 10 MG tablet TAKE 1 TABLET BY MOUTH EVERY DAY 90 tablet 0   cetirizine (ZYRTEC) 10 MG tablet Take 10 mg by mouth daily.     cholecalciferol (VITAMIN D) 1000 units tablet Take 1,000 Units by mouth daily.     fish oil-omega-3 fatty acids 1000 MG capsule Take 2 g by mouth daily.     fluticasone (FLONASE) 50 MCG/ACT nasal spray Place 2 sprays into both nostrils daily.     mesalamine (LIALDA) 1.2 g EC tablet Take 2 tablets (2.4 g total) by mouth daily with breakfast. 180 tablet 3   Multiple Vitamin (MULTIVITAMIN) tablet Take 1 tablet by mouth daily.     scopolamine (TRANSDERM-SCOP) 1 MG/3DAYS Place 1 patch (1.5 mg total) onto the skin every 3 (three) days. (Patient taking differently: Place 1 patch onto the skin every 3 (three) days. Uses for travel sickness) 10 patch 12   No current facility-administered medications on file prior to visit.   Allergies  Allergen Reactions   Codeine Nausea And Vomiting   Sulfa Antibiotics Rash   Social History   Socioeconomic History   Marital status: Married    Spouse name: Not  on file   Number of children: 2   Years of education: Not on file   Highest education level: 12th grade  Occupational History   Occupation: [employer]  Tobacco Use   Smoking status: Former    Types: Cigarettes   Smokeless tobacco: Former    Types: Associate Professor status: Never Used  Substance and Sexual Activity   Alcohol use: Not Currently   Drug use: Never   Sexual activity: Not on file  Other Topics Concern   Not on file  Social History Narrative   2 son's and 2 step son's   Social Determinants of Health   Financial Resource Strain: Low Risk  (11/25/2022)   Overall Financial Resource Strain (CARDIA)    Difficulty of Paying Living Expenses: Not very hard  Food Insecurity: No Food Insecurity (11/25/2022)   Hunger Vital Sign    Worried About Running Out of Food in the Last Year: Never true    Ran Out of Food in the Last Year: Never true  Transportation Needs: No Transportation Needs (11/25/2022)   PRAPARE - Administrator, Civil Service (Medical): No    Lack of Transportation (Non-Medical): No  Physical Activity: Unknown (11/25/2022)   Exercise Vital Sign    Days  of Exercise per Week: Patient declined    Minutes of Exercise per Session: Not on file  Stress: No Stress Concern Present (11/25/2022)   [employer] of Occupational Health - Occupational Stress Questionnaire    Feeling of Stress : Not at all  Social Connections: Socially Isolated (11/25/2022)   Social Connection and Isolation Panel [NHANES]    Frequency of Communication with Friends and Family: Once a week    Frequency of Social Gatherings with Friends and Family: Never    Attends Religious Services: Never    Database administrator or Organizations: No    Attends Engineer, structural: Not on file    Marital Status: Married  Catering manager Violence: Not on file     Review of Systems  All other systems reviewed and are negative.      Objective:   Physical  Exam Constitutional:      General: He is not in acute distress.    Appearance: Normal appearance. He is well-developed. He is obese. He is not ill-appearing or toxic-appearing.  HENT:     Right Ear: Tympanic membrane and ear canal normal.     Left Ear: Tympanic membrane and ear canal normal.     Nose: No congestion or rhinorrhea.     Mouth/Throat:     Mouth: Mucous membranes are moist.     Pharynx: No oropharyngeal exudate or posterior oropharyngeal erythema.  Eyes:     General: No scleral icterus.    Extraocular Movements: Extraocular movements intact.     Conjunctiva/sclera: Conjunctivae normal.     Pupils: Pupils are equal, round, and reactive to light.  Neck:     Vascular: No carotid bruit.  Cardiovascular:     Rate and Rhythm: Normal rate and regular rhythm.     Pulses: Normal pulses.     Heart sounds: Normal heart sounds. No murmur heard.    No friction rub. No gallop.  Pulmonary:     Breath sounds: No stridor. No wheezing, rhonchi or rales.  Abdominal:     General: Bowel sounds are normal. There is no distension. There are no signs of injury.     Palpations: Abdomen is soft. There is no mass.     Tenderness: There is no abdominal tenderness. There is no guarding or rebound.     Hernia: No hernia is present.  Musculoskeletal:     Cervical back: Normal range of motion and neck supple. No rigidity or tenderness.     Right lower leg: No edema.     Left lower leg: No edema.  Lymphadenopathy:     Cervical: No cervical adenopathy.  Skin:    General: Skin is warm.     Coloration: Skin is not jaundiced.     Findings: No bruising, erythema, lesion or rash.  Neurological:     General: No focal deficit present.     Mental Status: He is alert and oriented to person, place, and time. Mental status is at baseline.     Cranial Nerves: No cranial nerve deficit.     Motor: No weakness.     Gait: Gait normal.     Deep Tendon Reflexes: Reflexes normal.  Psychiatric:        Mood and  Affect: Mood normal.        Behavior: Behavior normal.        Thought Content: Thought content normal.        Judgment: Judgment normal.  Assessment & Plan:  Need for diphtheria-tetanus-pertussis (Tdap) vaccine - Plan: Tdap vaccine greater than or equal to 7yo IM  Controlled type 2 diabetes mellitus without complication, without long-term current use of insulin (HCC)  General medical exam Patient's fasting blood sugar is 109.  His A1c is pending at the time of this dictation but I anticipate it to be less than 6.5.  Recommended a diabetic eye exam.  Cholesterol is outstanding.  Blood pressure is excellent.  Recommended a flu shot, COVID shot, tetanus shot, and Pneumovax 23.  Patient received a tetanus shot today but defers the others.  Colonoscopy is due in 2027.  PSA is pending.

## 2022-12-01 LAB — COMPLETE METABOLIC PANEL WITH GFR
AG Ratio: 1.5 (calc) (ref 1.0–2.5)
ALT: 20 U/L (ref 9–46)
AST: 21 U/L (ref 10–35)
Albumin: 4.4 g/dL (ref 3.6–5.1)
Alkaline phosphatase (APISO): 52 U/L (ref 35–144)
BUN: 17 mg/dL (ref 7–25)
CO2: 28 mmol/L (ref 20–32)
Calcium: 9.5 mg/dL (ref 8.6–10.3)
Chloride: 104 mmol/L (ref 98–110)
Creat: 1.05 mg/dL (ref 0.70–1.35)
Globulin: 2.9 g/dL (ref 1.9–3.7)
Glucose, Bld: 109 mg/dL — ABNORMAL HIGH (ref 65–99)
Potassium: 4.7 mmol/L (ref 3.5–5.3)
Sodium: 139 mmol/L (ref 135–146)
Total Bilirubin: 0.8 mg/dL (ref 0.2–1.2)
Total Protein: 7.3 g/dL (ref 6.1–8.1)
eGFR: 79 mL/min/{1.73_m2} (ref >=60)

## 2022-12-01 LAB — CBC WITH DIFFERENTIAL/PLATELET
Absolute Monocytes: 781 {cells}/uL (ref 200–950)
Basophils Absolute: 42 {cells}/uL (ref 0–200)
Basophils Relative: 0.5 %
Eosinophils Absolute: 403 {cells}/uL (ref 15–500)
Eosinophils Relative: 4.8 %
HCT: 50.2 % — ABNORMAL HIGH (ref 38.5–50.0)
Hemoglobin: 16.3 g/dL (ref 13.2–17.1)
Lymphs Abs: 1730 {cells}/uL (ref 850–3900)
MCH: 30 pg (ref 27.0–33.0)
MCHC: 32.5 g/dL (ref 32.0–36.0)
MCV: 92.3 fL (ref 80.0–100.0)
MPV: 11.8 fL (ref 7.5–12.5)
Monocytes Relative: 9.3 %
Neutro Abs: 5443 {cells}/uL (ref 1500–7800)
Neutrophils Relative %: 64.8 %
Platelets: 238 10*3/uL (ref 140–400)
RBC: 5.44 10*6/uL (ref 4.20–5.80)
RDW: 12.1 % (ref 11.0–15.0)
Total Lymphocyte: 20.6 %
WBC: 8.4 10*3/uL (ref 3.8–10.8)

## 2022-12-01 LAB — TSH: TSH: 2.34 m[IU]/L (ref 0.40–4.50)

## 2022-12-01 LAB — TEST AUTHORIZATION

## 2022-12-01 LAB — LIPID PANEL
Cholesterol: 138 mg/dL (ref <200)
HDL: 42 mg/dL (ref >=40)
LDL Cholesterol (Calc): 73 mg/dL
Non-HDL Cholesterol (Calc): 96 mg/dL (ref <130)
Total CHOL/HDL Ratio: 3.3 (calc) (ref <5.0)
Triglycerides: 143 mg/dL (ref <150)

## 2022-12-01 LAB — HEMOGLOBIN A1C
Hgb A1c MFr Bld: 6.2 %{Hb} — ABNORMAL HIGH (ref <5.7)
Mean Plasma Glucose: 131 mg/dL
eAG (mmol/L): 7.3 mmol/L

## 2022-12-01 LAB — PSA: PSA: 1.91 ng/mL (ref <=4.00)

## 2022-12-17 ENCOUNTER — Other Ambulatory Visit: Payer: Self-pay | Admitting: Family Medicine

## 2022-12-17 DIAGNOSIS — E7800 Pure hypercholesterolemia, unspecified: Secondary | ICD-10-CM

## 2022-12-19 NOTE — Telephone Encounter (Signed)
Last OV 11/29/22 within protocol.  Requested Prescriptions  Pending Prescriptions Disp Refills   atorvastatin (LIPITOR) 10 MG tablet [Pharmacy Med Name: ATORVASTATIN 10 MG TABLET] 90 tablet 0    Sig: TAKE 1 TABLET BY MOUTH EVERY DAY     Cardiovascular:  Antilipid - Statins Failed - 12/17/2022  7:24 AM      Failed - Valid encounter within last 12 months    Recent Outpatient Visits           1 year ago Bronchitis   [facility] Pickard, Priscille Heidelberg, MD   1 year ago Right lower quadrant abdominal pain   [facility] Pickard, Priscille Heidelberg, MD   2 years ago General medical exam   [facility] Donita Brooks, MD   3 years ago Colon cancer screening   [facility] Pickard, Priscille Heidelberg, MD   4 years ago General medical exam   [facility] Donita Brooks, MD              Failed - Lipid Panel in normal range within the last 12 months    Cholesterol  Date Value Ref Range Status  11/25/2022 138 <200 mg/dL Final   LDL Cholesterol (Calc)  Date Value Ref Range Status  11/25/2022 73 mg/dL (calc) Final    Comment:    Reference range: <100 . Desirable range <100 mg/dL for primary prevention;   <70 mg/dL for patients with CHD or diabetic patients  with > or = 2 CHD risk factors. Marland Kitchen LDL-C is now calculated using the Martin-Hopkins  calculation, which is a validated novel method providing  better accuracy than the Friedewald equation in the  estimation of LDL-C.  Horald Pollen et al. Lenox Ahr. 2952;841(32): 2061-2068  (http://education.[facility].com/faq/FAQ164)    HDL  Date Value Ref Range Status  11/25/2022 42 > OR = 40 mg/dL Final   Triglycerides  Date Value Ref Range Status  11/25/2022 143 <150 mg/dL Final         Passed - Patient is not pregnant       '

## 2023-02-16 ENCOUNTER — Encounter: Payer: Self-pay | Admitting: Family Medicine

## 2023-02-16 ENCOUNTER — Ambulatory Visit (INDEPENDENT_AMBULATORY_CARE_PROVIDER_SITE_OTHER): Payer: BC Managed Care – PPO | Admitting: Family Medicine

## 2023-02-16 VITALS — BP 132/76 | HR 75 | Temp 98.2°F | Ht 70.0 in | Wt 223.4 lb

## 2023-02-16 DIAGNOSIS — M7582 Other shoulder lesions, left shoulder: Secondary | ICD-10-CM | POA: Diagnosis not present

## 2023-02-16 NOTE — Progress Notes (Signed)
Subjective:    Patient ID: Victor Chandler, male    DOB: 1955/05/01, 67 y.o.   MRN: [id number]  Shoulder Pain   Patient reports pain in both shoulders left greater than right.  He has a difficult time lifting his left arm greater than 90 degrees.  He has pain with abduction, pain with internal and external rotation.  He has good strength but significant pain with empty can testing.  He has pain with Hawkins maneuver.  He has pain with O'Brien's maneuver.  Exam of the right shoulder is similar but not as severe. Past Medical History:  Diagnosis Date   Allergy    Colitis    GERD (gastroesophageal reflux disease)    Hyperlipidemia    Prediabetes    Past Surgical History:  Procedure Laterality Date   LUMBAR LAMINECTOMY  2019   VASECTOMY N/A    [facility]reesia 09/28/2019   Current Outpatient Medications on File Prior to Visit  Medication Sig Dispense Refill   atorvastatin (LIPITOR) 10 MG tablet TAKE 1 TABLET BY MOUTH EVERY DAY 90 tablet 0   cetirizine (ZYRTEC) 10 MG tablet Take 10 mg by mouth daily.     cholecalciferol (VITAMIN D) 1000 units tablet Take 1,000 Units by mouth daily.     fish oil-omega-3 fatty acids 1000 MG capsule Take 2 g by mouth daily.     fluticasone (FLONASE) 50 MCG/ACT nasal spray Place 2 sprays into both nostrils daily.     mesalamine (LIALDA) 1.2 g EC tablet Take 2 tablets (2.4 g total) by mouth daily with breakfast. 180 tablet 3   Multiple Vitamin (MULTIVITAMIN) tablet Take 1 tablet by mouth daily.     scopolamine (TRANSDERM-SCOP) 1 MG/3DAYS Place 1 patch (1.5 mg total) onto the skin every 3 (three) days. (Patient taking differently: Place 1 patch onto the skin every 3 (three) days. Uses for travel sickness) 10 patch 12   No current facility-administered medications on file prior to visit.   Allergies  Allergen Reactions   Codeine Nausea And Vomiting   Sulfa Antibiotics Rash   Social History   Socioeconomic History   Marital status: Married    Spouse name: Not  on file   Number of children: 2   Years of education: Not on file   Highest education level: 12th grade  Occupational History   Occupation: [employer]  Tobacco Use   Smoking status: Former    Types: Cigarettes   Smokeless tobacco: Former    Types: Associate Professor status: Never Used  Substance and Sexual Activity   Alcohol use: Not Currently   Drug use: Never   Sexual activity: Not on file  Other Topics Concern   Not on file  Social History Narrative   2 son's and 2 step son's   Social Drivers of Corporate investment banker Strain: Low Risk  (11/25/2022)   Overall Financial Resource Strain (CARDIA)    Difficulty of Paying Living Expenses: Not very hard  Food Insecurity: No Food Insecurity (02/15/2023)   Hunger Vital Sign    Worried About Running Out of Food in the Last Year: Never true    Ran Out of Food in the Last Year: Never true  Transportation Needs: No Transportation Needs (02/15/2023)   PRAPARE - Administrator, Civil Service (Medical): No    Lack of Transportation (Non-Medical): No  Physical Activity: Insufficiently Active (02/15/2023)   Exercise Vital Sign    Days of Exercise per Week:  3 days    Minutes of Exercise per Session: 20 min  Stress: No Stress Concern Present (02/15/2023)   [employer] of Occupational Health - Occupational Stress Questionnaire    Feeling of Stress : Not at all  Social Connections: Socially Isolated (02/15/2023)   Social Connection and Isolation Panel [NHANES]    Frequency of Communication with Friends and Family: Once a week    Frequency of Social Gatherings with Friends and Family: Never    Attends Religious Services: Never    Database administrator or Organizations: No    Attends Engineer, structural: Not on file    Marital Status: Married  Catering manager Violence: Not on file     Review of Systems  All other systems reviewed and are negative.      Objective:   Physical  Exam Constitutional:      General: He is not in acute distress.    Appearance: Normal appearance. He is well-developed. He is obese. He is not ill-appearing or toxic-appearing.  Cardiovascular:     Rate and Rhythm: Normal rate and regular rhythm.     Heart sounds: Normal heart sounds. No murmur heard.    No friction rub.  Pulmonary:     Breath sounds: No stridor. No wheezing, rhonchi or rales.  Abdominal:     General: Bowel sounds are decreased. There are no signs of injury.  Musculoskeletal:     Right shoulder: Tenderness present. Decreased range of motion. Normal strength.     Left shoulder: Tenderness present. Decreased range of motion. Decreased strength.  Neurological:     Mental Status: He is alert.        Assessment & Plan:  Rotator cuff tendonitis, left Patient has tendinitis in his rotator cuff left greater than right.  Using sterile technique, I injected the left shoulder with 2 cc of lidocaine, 2 cc of Marcaine, and 2 cc of 40 mg/mL Kenalog.  The patient procedure well.  Orthopedics if the pain persist.

## 2023-02-23 MED ORDER — TRIAMCINOLONE ACETONIDE 40 MG/ML IJ SUSP
80.0000 mg | Freq: Once | INTRAMUSCULAR | Status: AC
Start: 2023-02-23 — End: 2023-02-16
  Administered 2023-02-16: 80 mg via INTRA_ARTICULAR

## 2023-02-23 NOTE — Addendum Note (Signed)
Addended by: Venia Carbon K on: 02/23/2023 04:31 PM   Modules accepted: Orders

## 2023-03-10 ENCOUNTER — Ambulatory Visit: Payer: Self-pay | Admitting: Family Medicine

## 2023-03-10 NOTE — Telephone Encounter (Signed)
 Copied from CRM [id number]. Topic: Clinical - Red Word Triage >> Mar 10, 2023  3:20 PM Deleta HERO wrote: Red Word that prompted transfer to Nurse Triage: left shoulder is causing this patient extreme pain and discomfort. He is having trouble moving this shoulder during day-to-day activities.  Chief Complaint: left shoulder pain Symptoms: pain in shoulder Frequency: states felt better after the shot 3 weeks ago but pain is back and intense.  Pertinent Negatives: Patient denies fever, numbness  Disposition: [] ED /[] Urgent Care (no appt availability in office) / [] Appointment(In office/virtual)/ []  [facility]/ [x] Home Care/ [] Refused Recommended Disposition /[] [facility]/ []  Follow-up with PCP Additional Notes: patient would like to be scheduled for an MRI and has a special company to go through so he is not required to pay for it.  Office to call patient back.  Care advice given; denies questions. Instructed to go to er if becomes worse.   Reason for Disposition  Shoulder pain  Answer Assessment - Initial Assessment Questions 1. ONSET: When did the pain start?    Ongoing 2. LOCATION: Where is the pain located?     Left shoulder 3. PAIN: How bad is the pain? (Scale 1-10; or mild, moderate, severe)   - MILD (1-3): doesn't interfere with normal activities   - MODERATE (4-7): interferes with normal activities (e.g., work or school) or awakens from sleep   - SEVERE (8-10): excruciating pain, unable to do any normal activities, unable to move arm at all due to pain     6-7/10 4. WORK OR EXERCISE: Has there been any recent work or exercise that involved this part of the body?     Chronic pain 5. CAUSE: What do you think is causing the shoulder pain?     Chronic pain 6. OTHER SYMPTOMS: Do you have any other symptoms? (e.g., neck pain, swelling, rash, fever, numbness, weakness)     Denies.  Protocols used: Shoulder Pain-A-AH

## 2023-03-14 ENCOUNTER — Telehealth: Payer: Self-pay

## 2023-03-14 ENCOUNTER — Other Ambulatory Visit: Payer: Self-pay | Admitting: Family Medicine

## 2023-03-14 DIAGNOSIS — M75102 Unspecified rotator cuff tear or rupture of left shoulder, not specified as traumatic: Secondary | ICD-10-CM

## 2023-03-14 NOTE — Telephone Encounter (Signed)
 Rock, Dr. Duanne entered an order for an MRI today for this patient. Can it be scheduled at [facility]? Thank you.   Copied from CRM [id number]. Topic: Medical Record Request - Other >> Mar 14, 2023 10:13 AM Arlina R wrote: Reason for CRM: Insurance covers 100% for [facility] [id number] Patient doesn't have to pay out of pocket. Compared to the doctor referring [facility] Patient wants to be sent to [facility].

## 2023-03-16 ENCOUNTER — Other Ambulatory Visit: Payer: Self-pay | Admitting: Family Medicine

## 2023-03-16 DIAGNOSIS — E7800 Pure hypercholesterolemia, unspecified: Secondary | ICD-10-CM

## 2023-03-16 NOTE — Telephone Encounter (Signed)
Requested Prescriptions  Pending Prescriptions Disp Refills   atorvastatin (LIPITOR) 10 MG tablet [Pharmacy Med Name: ATORVASTATIN 10 MG TABLET] 90 tablet 0    Sig: TAKE 1 TABLET BY MOUTH EVERY DAY     Cardiovascular:  Antilipid - Statins Failed - 03/16/2023 10:53 AM      Failed - Valid encounter within last 12 months    Recent Outpatient Visits           1 year ago Bronchitis   [facility] Pickard, Priscille Heidelberg, MD   1 year ago Right lower quadrant abdominal pain   [facility] Pickard, Priscille Heidelberg, MD   2 years ago General medical exam   [facility] Donita Brooks, MD   3 years ago Colon cancer screening   [facility] Pickard, Priscille Heidelberg, MD   4 years ago General medical exam   [facility] Donita Brooks, MD              Failed - Lipid Panel in normal range within the last 12 months    Cholesterol  Date Value Ref Range Status  11/25/2022 138 <200 mg/dL Final   LDL Cholesterol (Calc)  Date Value Ref Range Status  11/25/2022 73 mg/dL (calc) Final    Comment:    Reference range: <100 . Desirable range <100 mg/dL for primary prevention;   <70 mg/dL for patients with CHD or diabetic patients  with > or = 2 CHD risk factors. Marland Kitchen LDL-C is now calculated using the Martin-Hopkins  calculation, which is a validated novel method providing  better accuracy than the Friedewald equation in the  estimation of LDL-C.  Horald Pollen et al. Lenox Ahr. 1610;960(45): 2061-2068  (http://education.[facility].com/faq/FAQ164)    HDL  Date Value Ref Range Status  11/25/2022 42 > OR = 40 mg/dL Final   Triglycerides  Date Value Ref Range Status  11/25/2022 143 <150 mg/dL Final         Passed - Patient is not pregnant

## 2023-04-06 ENCOUNTER — Encounter: Payer: Self-pay | Admitting: Family Medicine

## 2023-04-18 ENCOUNTER — Encounter: Payer: Self-pay | Admitting: Family Medicine

## 2023-04-18 ENCOUNTER — Other Ambulatory Visit: Payer: Self-pay

## 2023-04-18 DIAGNOSIS — M7582 Other shoulder lesions, left shoulder: Secondary | ICD-10-CM

## 2023-05-08 DIAGNOSIS — M75102 Unspecified rotator cuff tear or rupture of left shoulder, not specified as traumatic: Secondary | ICD-10-CM | POA: Diagnosis not present

## 2023-05-18 DIAGNOSIS — M25512 Pain in left shoulder: Secondary | ICD-10-CM | POA: Diagnosis not present

## 2023-05-18 DIAGNOSIS — M25612 Stiffness of left shoulder, not elsewhere classified: Secondary | ICD-10-CM | POA: Diagnosis not present

## 2023-05-22 DIAGNOSIS — M25612 Stiffness of left shoulder, not elsewhere classified: Secondary | ICD-10-CM | POA: Diagnosis not present

## 2023-05-22 DIAGNOSIS — M25512 Pain in left shoulder: Secondary | ICD-10-CM | POA: Diagnosis not present

## 2023-05-25 DIAGNOSIS — M25612 Stiffness of left shoulder, not elsewhere classified: Secondary | ICD-10-CM | POA: Diagnosis not present

## 2023-05-25 DIAGNOSIS — M25512 Pain in left shoulder: Secondary | ICD-10-CM | POA: Diagnosis not present

## 2023-05-29 DIAGNOSIS — M25512 Pain in left shoulder: Secondary | ICD-10-CM | POA: Diagnosis not present

## 2023-05-29 DIAGNOSIS — M25612 Stiffness of left shoulder, not elsewhere classified: Secondary | ICD-10-CM | POA: Diagnosis not present

## 2023-06-01 DIAGNOSIS — M25512 Pain in left shoulder: Secondary | ICD-10-CM | POA: Diagnosis not present

## 2023-06-01 DIAGNOSIS — M25612 Stiffness of left shoulder, not elsewhere classified: Secondary | ICD-10-CM | POA: Diagnosis not present

## 2023-06-05 DIAGNOSIS — M25612 Stiffness of left shoulder, not elsewhere classified: Secondary | ICD-10-CM | POA: Diagnosis not present

## 2023-06-05 DIAGNOSIS — M25512 Pain in left shoulder: Secondary | ICD-10-CM | POA: Diagnosis not present

## 2023-06-08 DIAGNOSIS — M25612 Stiffness of left shoulder, not elsewhere classified: Secondary | ICD-10-CM | POA: Diagnosis not present

## 2023-06-08 DIAGNOSIS — M25512 Pain in left shoulder: Secondary | ICD-10-CM | POA: Diagnosis not present

## 2023-06-12 DIAGNOSIS — M25612 Stiffness of left shoulder, not elsewhere classified: Secondary | ICD-10-CM | POA: Diagnosis not present

## 2023-06-12 DIAGNOSIS — M25512 Pain in left shoulder: Secondary | ICD-10-CM | POA: Diagnosis not present

## 2023-06-21 DIAGNOSIS — M75112 Incomplete rotator cuff tear or rupture of left shoulder, not specified as traumatic: Secondary | ICD-10-CM | POA: Diagnosis not present

## 2023-07-06 ENCOUNTER — Other Ambulatory Visit

## 2023-07-06 ENCOUNTER — Encounter: Payer: Self-pay | Admitting: Gastroenterology

## 2023-07-06 ENCOUNTER — Ambulatory Visit: Admitting: Gastroenterology

## 2023-07-06 VITALS — BP 120/70 | HR 100 | Ht 69.5 in | Wt 223.5 lb

## 2023-07-06 DIAGNOSIS — K5000 Crohn's disease of small intestine without complications: Secondary | ICD-10-CM | POA: Diagnosis not present

## 2023-07-06 LAB — COMPREHENSIVE METABOLIC PANEL WITH GFR
ALT: 18 U/L (ref 0–53)
AST: 27 U/L (ref 0–37)
Albumin: 4.7 g/dL (ref 3.5–5.2)
Alkaline Phosphatase: 52 U/L (ref 39–117)
BUN: 22 mg/dL (ref 6–23)
CO2: 24 meq/L (ref 19–32)
Calcium: 9.2 mg/dL (ref 8.4–10.5)
Chloride: 104 meq/L (ref 96–112)
Creatinine, Ser: 1.14 mg/dL (ref 0.40–1.50)
GFR: 67.47 mL/min (ref >60.00)
Glucose, Bld: 98 mg/dL (ref 70–99)
Potassium: 3.8 meq/L (ref 3.5–5.1)
Sodium: 137 meq/L (ref 135–145)
Total Bilirubin: 0.4 mg/dL (ref 0.2–1.2)
Total Protein: 7.8 g/dL (ref 6.0–8.3)

## 2023-07-06 LAB — CBC WITH DIFFERENTIAL/PLATELET
Basophils Absolute: 0.1 10*3/uL (ref 0.0–0.1)
Basophils Relative: 0.8 % (ref 0.0–3.0)
Eosinophils Absolute: 0.3 10*3/uL (ref 0.0–0.7)
Eosinophils Relative: 3.2 % (ref 0.0–5.0)
HCT: 44.2 % (ref 39.0–52.0)
Hemoglobin: 15 g/dL (ref 13.0–17.0)
Lymphocytes Relative: 22.9 % (ref 12.0–46.0)
Lymphs Abs: 1.9 10*3/uL (ref 0.7–4.0)
MCHC: 33.9 g/dL (ref 30.0–36.0)
MCV: 92.7 fl (ref 78.0–100.0)
Monocytes Absolute: 0.8 10*3/uL (ref 0.1–1.0)
Monocytes Relative: 10.2 % (ref 3.0–12.0)
Neutro Abs: 5.2 10*3/uL (ref 1.4–7.7)
Neutrophils Relative %: 62.9 % (ref 43.0–77.0)
Platelets: 251 10*3/uL (ref 150.0–400.0)
RBC: 4.77 Mil/uL (ref 4.22–5.81)
RDW: 13.1 % (ref 11.5–15.5)
WBC: 8.3 10*3/uL (ref 4.0–10.5)

## 2023-07-06 LAB — C-REACTIVE PROTEIN: CRP: <1 mg/dL (ref 0.5–20.0)

## 2023-07-06 LAB — SEDIMENTATION RATE: Sed Rate: 26 mm/h — ABNORMAL HIGH (ref 0–20)

## 2023-07-06 MED ORDER — MESALAMINE 1.2 G PO TBEC
2.4000 g | DELAYED_RELEASE_TABLET | Freq: Every day | ORAL | 3 refills | Status: DC
Start: 2023-07-06 — End: 2023-07-10

## 2023-07-06 NOTE — Progress Notes (Addendum)
 Chief Complaint:follow-up, med refills Primary GI Doctor: (previously Dr. Sandrea Cruel) Dr. Yvone Herd  HPI: This is a 68 year old male with Crohn's ileitis, previously known to Dr. Sandrea Cruel. Last seen in the office by Dr. Sandrea Cruel on 05/03/22.  He noted during the visit occasional mild LLQ pain. No diarrhea, bleeding or weight loss. Feels well overall.    IBD history: Per notes patient states per note (07/24/20) on a colonoscopy performed elsewhere about 10 years ago he was told of "spots in his colon" suggesting mild Crohn's disease.  Crohn's ileitis diagnosed via colonoscopy in 2022, patient had resolution of his very mild symptoms on Budesonide  9 mg daily. 01/17/2022 he was changed from budesonide  to Lialda .  06/05/20 colonoscopy with Dr. Sandrea Cruel, recall 5 years Impression:  - One 6 mm polyp in the ascending colon, removed with a cold snare. Resected and retrieved.  - A few erosions at the ileocecal valve. Biopsied.  - Several ulcers and erosions in the terminal ileum. Biopsied. - Internal hemorrhoids.  - The examination was otherwise normal on direct and retroflexion views. Path: Diagnosis 1. Surgical [P], small bowel, terminal ileum - CHRONIC ACTIVE ILEITIS. - NO DYSPLASIA OR MALIGNANCY. 2. Surgical [P], small valve, ileocecal valve - CHRONIC MILDLY ACTIVE COLITIS. - NO DYSPLASIA OR MALIGNANCY. 3. Surgical [P], colon, ascending, polyp (1) - SESSILE SERRATED POLYP. - NO DYSPLASIA OR MALIGNANCY.   Interval History    Patient presents for follow-up and medication refills for his Crohn's. He denies blood in stool or abdominal pain. He has occasional diarrhea if he eats fatty greasy foods which he tries to avoid. He is taking Lialda  1.2 gm 2 tablets po daily and asks about alternative medications due to costs of medication and recent insurance changes.  He has retired in February and states he is staying busy with projects.   Wt Readings from Last 3 Encounters:  07/06/23 223 lb 8 oz (101.4 kg)   02/16/23 223 lb 6 oz (101.3 kg)  11/29/22 225 lb (102.1 kg)     Past Medical History:  Diagnosis Date   Allergy    Colitis    GERD (gastroesophageal reflux disease)    Hyperlipidemia    Prediabetes     Past Surgical History:  Procedure Laterality Date   LUMBAR LAMINECTOMY  2019   VASECTOMY N/A    [facility]ia 09/28/2019    Current Outpatient Medications  Medication Sig Dispense Refill   cetirizine (ZYRTEC) 10 MG tablet Take 10 mg by mouth daily.     cholecalciferol (VITAMIN D ) 1000 units tablet Take 1,000 Units by mouth daily.     fish oil-omega-3 fatty acids 1000 MG capsule Take 2 g by mouth daily.     fluticasone (FLONASE) 50 MCG/ACT nasal spray Place 2 sprays into both nostrils daily.     Multiple Vitamin (MULTIVITAMIN) tablet Take 1 tablet by mouth daily.     scopolamine  (TRANSDERM-SCOP) 1 MG/3DAYS Place 1 patch (1.5 mg total) onto the skin every 3 (three) days. (Patient taking differently: Place 1 patch onto the skin every 3 (three) days. Uses for travel sickness) 10 patch 12   mesalamine  (LIALDA ) 1.2 g EC tablet Take 2 tablets (2.4 g total) by mouth daily with breakfast. 180 tablet 3   No current facility-administered medications for this visit.    Allergies as of 07/06/2023 - Review Complete 07/06/2023  Allergen Reaction Noted   Codeine Nausea And Vomiting 12/04/2012   Sulfa antibiotics Rash 12/04/2012    Family History  Problem Relation  Age of Onset   Dementia Mother    Colon polyps Neg Hx    Colon cancer Neg Hx    Esophageal cancer Neg Hx    Stomach cancer Neg Hx    Rectal cancer Neg Hx    Pancreatic cancer Neg Hx    Liver disease Neg Hx     Review of Systems:    Constitutional: No weight loss, fever, chills, weakness or fatigue HEENT: Eyes: No change in vision               Ears, Nose, Throat:  No change in hearing or congestion Skin: No rash or itching Cardiovascular: No chest pain, chest pressure or palpitations   Respiratory: No SOB or  cough Gastrointestinal: See HPI and otherwise negative Genitourinary: No dysuria or change in urinary frequency Neurological: No headache, dizziness or syncope Musculoskeletal: No new muscle or joint pain Hematologic: No bleeding or bruising Psychiatric: No history of depression or anxiety    Physical Exam:  Vital signs: BP 120/70 (BP Location: Left Arm, Patient Position: Sitting, Cuff Size: Large)   Pulse 100   Ht 5' 9.5" (1.765 m)   Wt 223 lb 8 oz (101.4 kg)   BMI 32.53 kg/m   Constitutional:   Pleasant male appears to be in NAD, Well developed, Well nourished, alert and cooperative Throat: Oral cavity and pharynx without inflammation, swelling or lesion.  Respiratory: Respirations even and unlabored. Lungs clear to auscultation bilaterally.   No wheezes, crackles, or rhonchi.  Cardiovascular: Normal S1, S2. Regular rate and rhythm. No peripheral edema, cyanosis or pallor.  Gastrointestinal:  Soft, nondistended, nontender. No rebound or guarding. Normal bowel sounds. No appreciable masses or hepatomegaly. Rectal:  Not performed.  Msk:  Symmetrical without gross deformities. Without edema, no deformity or joint abnormality.  Neurologic:  Alert and  oriented x4;  grossly normal neurologically.  Skin:   Dry and intact without significant lesions or rashes. Psychiatric: Oriented to person, place and time. Demonstrates good judgement and reason without abnormal affect or behaviors.  RELEVANT LABS AND IMAGING: CBC    Latest Ref Rng & Units 11/25/2022    8:00 AM 05/03/2022    3:07 PM 11/22/2021    8:03 AM  CBC  WBC 3.8 - 10.8 Thousand/uL 8.4  10.0  9.7   Hemoglobin 13.2 - 17.1 g/dL 02.7  25.3  66.4   Hematocrit 38.5 - 50.0 % 50.2  43.6  49.0   Platelets 140 - 400 Thousand/uL 238  226.0  241      CMP     Latest Ref Rng & Units 11/25/2022    8:00 AM 05/03/2022    3:07 PM 02/24/2022    4:41 PM  CMP  Glucose 65 - 99 mg/dL 403  84  474   BUN 7 - 25 mg/dL 17  24  23    Creatinine  0.70 - 1.35 mg/dL 2.59  5.63  8.75   Sodium 135 - 146 mmol/L 139  139  140   Potassium 3.5 - 5.3 mmol/L 4.7  3.8  4.0   Chloride 98 - 110 mmol/L 104  105  104   CO2 20 - 32 mmol/L 28  23  27    Calcium  8.6 - 10.3 mg/dL 9.5  9.5  9.0   Total Protein 6.1 - 8.1 g/dL 7.3  7.3    Total Bilirubin 0.2 - 1.2 mg/dL 0.8  0.5    Alkaline Phos 39 - 117 U/L  59    AST 10 -  35 U/L 21  25    ALT 9 - 46 U/L 20  17       Lab Results  Component Value Date   TSH 2.34 11/25/2022   05/03/22 labs show: Sed rate 23, CRP 1.750  Assessment: Encounter Diagnosis  Name Primary?   Crohn's disease of small intestine without complication [facility]) Yes      68 year old male patient with history of Crohn's ileitis and has remained in clinical remission on Lialda  2.4 gms daily. Will obtain follow-up labs with inflammatory markers today. He enquires about alternative therapies for Crohn's that are cheaper, I gave him a few pharmacies to check out and compare prices then message me. Also encouraged UTD on vaccinations and yearly eye and skin exams.  Plan: -Check CBC, CMP, ESR, CRP -Check Fecal calprotectin -Continue Lialda  2.4 g every day, patient enquired about alternative therapies and provided pharmacies to compare pricing -REV in 1 year with Dr. Yvone Herd  Thank you for the courtesy of this consult. Please call me with any questions or concerns.   Jesselyn Rask, FNP-C [facility] 07/06/2023, 4:10 PM  Cc: Austine Lefort, MD  I have reviewed the clinic note as outlined by Arlon Lamb, NP and agree with the assessment, plan and medical decision making.  Mr. Healan has a history of uncomplicated Crohn's ileitis managed with a moderate dose of mesalamine  in the form of Lialda  2.4 g daily.  Denies active symptoms.  Expressed concern about cost.  Pharmacy options to be provided to patient.  Agree with annual mesalamine  monitoring labs.  Up-to-date for colorectal cancer screening.  Eugenia Hess, MD

## 2023-07-06 NOTE — Patient Instructions (Addendum)
 Check with other pharmacies for better pricing : [facility]  Your provider has requested that you go to the basement level for lab work before leaving today. Press "B" on the elevator. The lab is located at the first door on the left as you exit the elevator.  _______________________________________________________  If your blood pressure at your visit was 140/90 or greater, please contact your primary care physician to follow up on this.  _______________________________________________________  If you are age 66 or older, your body mass index should be between 23-30. Your Body mass index is 32.53 kg/m. If this is out of the aforementioned range listed, please consider follow up with your Primary Care Provider.  If you are age 81 or younger, your body mass index should be between 19-25. Your Body mass index is 32.53 kg/m. If this is out of the aformentioned range listed, please consider follow up with your Primary Care Provider.   ________________________________________________________  The [facility] providers would like to encourage you to use MYCHART to communicate with providers for non-urgent requests or questions.  Due to long hold times on the telephone, sending your provider a message by [facility] may be a faster and more efficient way to get a response.  Please allow 48 business hours for a response.  Please remember that this is for non-urgent requests.  _______________________________________________________  Thank you for trusting me with your gastrointestinal care!   Dyanna Glasgow, RNP

## 2023-07-10 ENCOUNTER — Other Ambulatory Visit

## 2023-07-10 ENCOUNTER — Other Ambulatory Visit: Payer: Self-pay | Admitting: Gastroenterology

## 2023-07-10 DIAGNOSIS — K5000 Crohn's disease of small intestine without complications: Secondary | ICD-10-CM

## 2023-07-10 MED ORDER — MESALAMINE 1.2 G PO TBEC: 2.4000 g | DELAYED_RELEASE_TABLET | Freq: Every day | ORAL | 3 refills | Status: DC

## 2023-07-10 NOTE — Progress Notes (Signed)
 Sent rx for lialda  to [facility] to see if more affordable.

## 2023-07-11 ENCOUNTER — Ambulatory Visit: Payer: Self-pay | Admitting: Gastroenterology

## 2023-07-12 LAB — CALPROTECTIN, FECAL: Calprotectin, Fecal: 494 ug/g — ABNORMAL HIGH (ref 0–120)

## 2023-10-02 ENCOUNTER — Telehealth: Payer: Self-pay

## 2023-10-02 NOTE — Telephone Encounter (Signed)
 Copied from CRM [id number]. Topic: Clinical - Request for Lab/Test Order >> Oct 02, 2023  2:59 PM Victor Chandler wrote: Reason for CRM: Patient would like to schedule labs for his yearly physical//

## 2023-10-26 ENCOUNTER — Other Ambulatory Visit

## 2023-10-26 DIAGNOSIS — Z0000 Encounter for general adult medical examination without abnormal findings: Secondary | ICD-10-CM

## 2023-10-26 DIAGNOSIS — G4710 Hypersomnia, unspecified: Secondary | ICD-10-CM

## 2023-10-26 DIAGNOSIS — E7800 Pure hypercholesterolemia, unspecified: Secondary | ICD-10-CM | POA: Diagnosis not present

## 2023-10-26 DIAGNOSIS — Z125 Encounter for screening for malignant neoplasm of prostate: Secondary | ICD-10-CM | POA: Diagnosis not present

## 2023-10-26 DIAGNOSIS — K50919 Crohn's disease, unspecified, with unspecified complications: Secondary | ICD-10-CM | POA: Diagnosis not present

## 2023-10-26 DIAGNOSIS — E119 Type 2 diabetes mellitus without complications: Secondary | ICD-10-CM | POA: Diagnosis not present

## 2023-10-27 ENCOUNTER — Ambulatory Visit: Payer: Self-pay | Admitting: Family Medicine

## 2023-10-27 LAB — COMPLETE METABOLIC PANEL WITHOUT GFR
AG Ratio: 1.6 (calc) (ref 1.0–2.5)
ALT: 20 U/L (ref 9–46)
AST: 25 U/L (ref 10–35)
Albumin: 4.6 g/dL (ref 3.6–5.1)
Alkaline phosphatase (APISO): 55 U/L (ref 35–144)
BUN: 19 mg/dL (ref 7–25)
CO2: 26 mmol/L (ref 20–32)
Calcium: 9.5 mg/dL (ref 8.6–10.3)
Chloride: 103 mmol/L (ref 98–110)
Creat: 1.09 mg/dL (ref 0.70–1.35)
Globulin: 2.9 g/dL (ref 1.9–3.7)
Glucose, Bld: 100 mg/dL — ABNORMAL HIGH (ref 65–99)
Potassium: 4.3 mmol/L (ref 3.5–5.3)
Sodium: 138 mmol/L (ref 135–146)
Total Bilirubin: 0.6 mg/dL (ref 0.2–1.2)
Total Protein: 7.5 g/dL (ref 6.1–8.1)

## 2023-10-27 LAB — MICROALBUMIN / CREATININE URINE RATIO
Creatinine, Urine: 106 mg/dL (ref 20–320)
Microalb Creat Ratio: 8 mg/g{creat} (ref <30)
Microalb, Ur: 0.8 mg/dL

## 2023-10-27 LAB — CBC WITH DIFFERENTIAL/PLATELET
Absolute Lymphocytes: 1701 {cells}/uL (ref 850–3900)
Absolute Monocytes: 774 {cells}/uL (ref 200–950)
Basophils Absolute: 51 {cells}/uL (ref 0–200)
Basophils Relative: 0.7 %
Eosinophils Absolute: 285 {cells}/uL (ref 15–500)
Eosinophils Relative: 3.9 %
HCT: 47.4 % (ref 38.5–50.0)
Hemoglobin: 15.4 g/dL (ref 13.2–17.1)
MCH: 30.4 pg (ref 27.0–33.0)
MCHC: 32.5 g/dL (ref 32.0–36.0)
MCV: 93.7 fL (ref 80.0–100.0)
MPV: 12 fL (ref 7.5–12.5)
Monocytes Relative: 10.6 %
Neutro Abs: 4490 {cells}/uL (ref 1500–7800)
Neutrophils Relative %: 61.5 %
Platelets: 249 Thousand/uL (ref 140–400)
RBC: 5.06 Million/uL (ref 4.20–5.80)
RDW: 12.3 % (ref 11.0–15.0)
Total Lymphocyte: 23.3 %
WBC: 7.3 Thousand/uL (ref 3.8–10.8)

## 2023-10-27 LAB — LIPID PANEL
Cholesterol: 208 mg/dL — ABNORMAL HIGH (ref <200)
HDL: 42 mg/dL (ref >=40)
LDL Cholesterol (Calc): 142 mg/dL — ABNORMAL HIGH
Non-HDL Cholesterol (Calc): 166 mg/dL — ABNORMAL HIGH (ref <130)
Total CHOL/HDL Ratio: 5 (calc) — ABNORMAL HIGH (ref <5.0)
Triglycerides: 120 mg/dL (ref <150)

## 2023-10-27 LAB — VITAMIN B12: Vitamin B-12: 384 pg/mL (ref 200–1100)

## 2023-10-27 LAB — PSA: PSA: 2.71 ng/mL (ref <=4.00)

## 2023-10-27 LAB — HEMOGLOBIN A1C
Hgb A1c MFr Bld: 6.1 % — ABNORMAL HIGH (ref <5.7)
Mean Plasma Glucose: 128 mg/dL
eAG (mmol/L): 7.1 mmol/L

## 2023-11-02 ENCOUNTER — Ambulatory Visit: Admitting: *Deleted

## 2023-11-02 ENCOUNTER — Other Ambulatory Visit: Payer: Self-pay | Admitting: Family Medicine

## 2023-11-02 VITALS — Ht 70.0 in | Wt 223.0 lb

## 2023-11-02 DIAGNOSIS — Z0000 Encounter for general adult medical examination without abnormal findings: Secondary | ICD-10-CM

## 2023-11-02 MED ORDER — ATORVASTATIN CALCIUM 10 MG PO TABS: 10.0000 mg | ORAL_TABLET | Freq: Every day | ORAL | 3 refills | Status: AC

## 2023-11-02 NOTE — Patient Instructions (Signed)
 Victor Chandler , Thank you for taking time to come for your Medicare Wellness Visit. I appreciate your ongoing commitment to your health goals. Please review the following plan we discussed and let me know if I can assist you in the future.   Screening recommendations/referrals: Colonoscopy: up to date Recommended yearly ophthalmology/optometry visit for glaucoma screening and checkup Recommended yearly dental visit for hygiene and checkup  Vaccinations: Influenza vaccine: Education provided Pneumococcal vaccine: up to date Tdap vaccine: up to date Shingles vaccine: up to date        Preventive Care 65 Years and Older, Male Preventive care refers to lifestyle choices and visits with your health care provider that can promote health and wellness. What does preventive care include? A yearly physical exam. This is also called an annual well check. Dental exams once or twice a year. Routine eye exams. Ask your health care provider how often you should have your eyes checked. Personal lifestyle choices, including: Daily care of your teeth and gums. Regular physical activity. Eating a healthy diet. Avoiding tobacco and drug use. Limiting alcohol use. Practicing safe sex. Taking low doses of aspirin every day. Taking vitamin and mineral supplements as recommended by your health care provider. What happens during an annual well check? The services and screenings done by your health care provider during your annual well check will depend on your age, overall health, lifestyle risk factors, and family history of disease. Counseling  Your health care provider may ask you questions about your: Alcohol use. Tobacco use. Drug use. Emotional well-being. Home and relationship well-being. Sexual activity. Eating habits. History of falls. Memory and ability to understand (cognition). Work and work Astronomer. Screening  You may have the following tests or measurements: Height, weight, and  BMI. Blood pressure. Lipid and cholesterol levels. These may be checked every 5 years, or more frequently if you are over 34 years old. Skin check. Lung cancer screening. You may have this screening every year starting at age 45 if you have a 30-pack-year history of smoking and currently smoke or have quit within the past 15 years. Fecal occult blood test (FOBT) of the stool. You may have this test every year starting at age 65. Flexible sigmoidoscopy or colonoscopy. You may have a sigmoidoscopy every 5 years or a colonoscopy every 10 years starting at age 72. Prostate cancer screening. Recommendations will vary depending on your family history and other risks. Hepatitis C blood test. Hepatitis B blood test. Sexually transmitted disease (STD) testing. Diabetes screening. This is done by checking your blood sugar (glucose) after you have not eaten for a while (fasting). You may have this done every 1-3 years. Abdominal aortic aneurysm (AAA) screening. You may need this if you are a current or former smoker. Osteoporosis. You may be screened starting at age 44 if you are at high risk. Talk with your health care provider about your test results, treatment options, and if necessary, the need for more tests. Vaccines  Your health care provider may recommend certain vaccines, such as: Influenza vaccine. This is recommended every year. Tetanus, diphtheria, and acellular pertussis (Tdap, Td) vaccine. You may need a Td booster every 10 years. Zoster vaccine. You may need this after age 97. Pneumococcal 13-valent conjugate (PCV13) vaccine. One dose is recommended after age 41. Pneumococcal polysaccharide (PPSV23) vaccine. One dose is recommended after age 101. Talk to your health care provider about which screenings and vaccines you need and how often you need them. This information is not  intended to replace advice given to you by your health care provider. Make sure you discuss any questions you have  with your health care provider. Document Released: 03/13/2015 Document Revised: 11/04/2015 Document Reviewed: 12/16/2014 Elsevier Interactive Patient Education  2017 [employer].  Fall Prevention in the Home Falls can cause injuries. They can happen to people of all ages. There are many things you can do to make your home safe and to help prevent falls. What can I do on the outside of my home? Regularly fix the edges of walkways and driveways and fix any cracks. Remove anything that might make you trip as you walk through a door, such as a raised step or threshold. Trim any bushes or trees on the path to your home. Use bright outdoor lighting. Clear any walking paths of anything that might make someone trip, such as rocks or tools. Regularly check to see if handrails are loose or broken. Make sure that both sides of any steps have handrails. Any raised decks and porches should have guardrails on the edges. Have any leaves, snow, or ice cleared regularly. Use sand or salt on walking paths during winter. Clean up any spills in your garage right away. This includes oil or grease spills. What can I do in the bathroom? Use night lights. Install grab bars by the toilet and in the tub and shower. Do not use towel bars as grab bars. Use non-skid mats or decals in the tub or shower. If you need to sit down in the shower, use a plastic, non-slip stool. Keep the floor dry. Clean up any water that spills on the floor as soon as it happens. Remove soap buildup in the tub or shower regularly. Attach bath mats securely with double-sided non-slip rug tape. Do not have throw rugs and other things on the floor that can make you trip. What can I do in the bedroom? Use night lights. Make sure that you have a light by your bed that is easy to reach. Do not use any sheets or blankets that are too big for your bed. They should not hang down onto the floor. Have a firm chair that has side arms. You can use  this for support while you get dressed. Do not have throw rugs and other things on the floor that can make you trip. What can I do in the kitchen? Clean up any spills right away. Avoid walking on wet floors. Keep items that you use a lot in easy-to-reach places. If you need to reach something above you, use a strong step stool that has a grab bar. Keep electrical cords out of the way. Do not use floor polish or wax that makes floors slippery. If you must use wax, use non-skid floor wax. Do not have throw rugs and other things on the floor that can make you trip. What can I do with my stairs? Do not leave any items on the stairs. Make sure that there are handrails on both sides of the stairs and use them. Fix handrails that are broken or loose. Make sure that handrails are as long as the stairways. Check any carpeting to make sure that it is firmly attached to the stairs. Fix any carpet that is loose or worn. Avoid having throw rugs at the top or bottom of the stairs. If you do have throw rugs, attach them to the floor with carpet tape. Make sure that you have a light switch at the top of the stairs  and the bottom of the stairs. If you do not have them, ask someone to add them for you. What else can I do to help prevent falls? Wear shoes that: Do not have high heels. Have rubber bottoms. Are comfortable and fit you well. Are closed at the toe. Do not wear sandals. If you use a stepladder: Make sure that it is fully opened. Do not climb a closed stepladder. Make sure that both sides of the stepladder are locked into place. Ask someone to hold it for you, if possible. Clearly mark and make sure that you can see: Any grab bars or handrails. First and last steps. Where the edge of each step is. Use tools that help you move around (mobility aids) if they are needed. These include: Canes. Walkers. Scooters. Crutches. Turn on the lights when you go into a dark area. Replace any light bulbs  as soon as they burn out. Set up your furniture so you have a clear path. Avoid moving your furniture around. If any of your floors are uneven, fix them. If there are any pets around you, be aware of where they are. Review your medicines with your doctor. Some medicines can make you feel dizzy. This can increase your chance of falling. Ask your doctor what other things that you can do to help prevent falls. This information is not intended to replace advice given to you by your health care provider. Make sure you discuss any questions you have with your health care provider. Document Released: 12/11/2008 Document Revised: 07/23/2015 Document Reviewed: 03/21/2014 Elsevier Interactive Patient Education  2017 [employer].

## 2023-11-02 NOTE — Progress Notes (Signed)
 Subjective:   Victor Chandler is a 68 y.o. male who presents for an Initial Medicare Annual Wellness Visit.  Visit Complete: Virtual I connected with  Victor Chandler on 11/02/23 by a audio enabled telemedicine application and verified that I am speaking with the correct person using two identifiers.  Patient Location: Home  Provider Location: Home Office  I discussed the limitations of evaluation and management by telemedicine. The patient expressed understanding and agreed to proceed.  Vital Signs: Because this visit was a virtual/telehealth visit, some criteria may be missing or patient reported. Any vitals not documented were not able to be obtained and vitals that have been documented are patient reported.   Cardiac Risk Factors include: advanced age (>27men, >37 women);male gender;obesity (BMI >30kg/m2)     Objective:    Today's Vitals   11/02/23 0949  Weight: 223 lb (101.2 kg)  Height: 5' 10 (1.778 m)   Body mass index is 32 kg/m.     11/02/2023    9:43 AM  Advanced Directives  Does Patient Have a Medical Advance Directive? No  Would patient like information on creating a medical advance directive? No - Patient declined    Current Medications (verified) Outpatient Encounter Medications as of 11/02/2023  Medication Sig   cetirizine (ZYRTEC) 10 MG tablet Take 10 mg by mouth daily.   cholecalciferol (VITAMIN D ) 1000 units tablet Take 1,000 Units by mouth daily.   fish oil-omega-3 fatty acids 1000 MG capsule Take 2 g by mouth daily.   fluticasone (FLONASE) 50 MCG/ACT nasal spray Place 2 sprays into both nostrils daily.   mesalamine  (LIALDA ) 1.2 g EC tablet Take 2 tablets (2.4 g total) by mouth daily with breakfast.   Multiple Vitamin (MULTIVITAMIN) tablet Take 1 tablet by mouth daily.   scopolamine  (TRANSDERM-SCOP) 1 MG/3DAYS Place 1 patch (1.5 mg total) onto the skin every 3 (three) days. (Patient taking differently: Place 1 patch onto the skin every 3 (three) days.  Uses for travel sickness)   No facility-administered encounter medications on file as of 11/02/2023.    Allergies (verified) Codeine and Sulfa antibiotics   History: Past Medical History:  Diagnosis Date   Allergy    Colitis    GERD (gastroesophageal reflux disease)    Hyperlipidemia    Prediabetes    Past Surgical History:  Procedure Laterality Date   LUMBAR LAMINECTOMY  2019   VASECTOMY N/A    [employer]ia 09/28/2019   Family History  Problem Relation Age of Onset   Dementia Mother    Colon polyps Neg Hx    Colon cancer Neg Hx    Esophageal cancer Neg Hx    Stomach cancer Neg Hx    Rectal cancer Neg Hx    Pancreatic cancer Neg Hx    Liver disease Neg Hx    Social History   Socioeconomic History   Marital status: Married    Spouse name: Not on file   Number of children: 2   Years of education: Not on file   Highest education level: 12th grade  Occupational History   Occupation: [employer]  Tobacco Use   Smoking status: Former    Types: Cigarettes   Smokeless tobacco: Former    Types: Associate Professor status: Never Used  Substance and Sexual Activity   Alcohol use: Not Currently   Drug use: Never   Sexual activity: Not Currently  Other Topics Concern   Not on file  Social History Narrative  2 son's and 2 step son's   Social Drivers of Corporate investment banker Strain: Low Risk  (11/02/2023)   Overall Financial Resource Strain (CARDIA)    Difficulty of Paying Living Expenses: Not hard at all  Food Insecurity: No Food Insecurity (11/02/2023)   Hunger Vital Sign    Worried About Running Out of Food in the Last Year: Never true    Ran Out of Food in the Last Year: Never true  Transportation Needs: No Transportation Needs (11/02/2023)   PRAPARE - Administrator, Civil Service (Medical): No    Lack of Transportation (Non-Medical): No  Physical Activity: Sufficiently Active (11/02/2023)   Exercise Vital Sign    Days of Exercise per Week: 5  days    Minutes of Exercise per Session: 50 min  Stress: No Stress Concern Present (11/02/2023)   [employer] of Occupational Health - Occupational Stress Questionnaire    Feeling of Stress: Not at all  Social Connections: Socially Isolated (11/02/2023)   Social Connection and Isolation Panel    Frequency of Communication with Friends and Family: Once a week    Frequency of Social Gatherings with Friends and Family: Once a week    Attends Religious Services: Never    Database administrator or Organizations: No    Attends Engineer, structural: Never    Marital Status: Married    Tobacco Counseling Counseling given: Not Answered   Clinical Intake:  Pre-visit preparation completed: Yes  Pain : No/denies pain     Diabetes: No  How often do you need to have someone help you when you read instructions, pamphlets, or other written materials from your doctor or pharmacy?: 1 - Never  Interpreter Needed?: No  Information entered by :: Mliss Graff LPN   Activities of Daily Living    11/02/2023    9:48 AM  In your present state of health, do you have any difficulty performing the following activities:  Hearing? 0  Vision? 0  Difficulty concentrating or making decisions? 0  Walking or climbing stairs? 0  Dressing or bathing? 0  Doing errands, shopping? 0  Preparing Food and eating ? N  Using the Toilet? N  In the past six months, have you accidently leaked urine? N  Do you have problems with loss of bowel control? N  Managing your Medications? N  Managing your Finances? N  Housekeeping or managing your Housekeeping? N    Patient Care Team: Duanne Butler DASEN, MD as PCP - General (Family Medicine)  Indicate any recent Medical Services you may have received from other than [facility] providers in the past year (date may be approximate).     Assessment:   This is a routine wellness examination for Victor Chandler.  Hearing/Vision screen Hearing Screening - Comments:: No  trouble hearing Vision Screening - Comments:: My eye doctor in [place] Every 2 years   Goals Addressed             This Visit's Progress    Patient Stated       Continue current lifestyle       Depression Screen    11/02/2023    9:52 AM 02/16/2023    3:52 PM 11/29/2022    3:13 PM 11/25/2021    2:56 PM 11/24/2020    2:42 PM 10/01/2019    2:49 PM 07/13/2017    2:40 PM  PHQ 2/9 Scores  PHQ - 2 Score 0 0 0 0 0 0 0  PHQ- 9 Score 1  2   1      Fall Risk    11/02/2023    9:50 AM 02/16/2023    3:50 PM 11/29/2022    3:13 PM 11/24/2020    2:42 PM 10/01/2019    2:49 PM  Fall Risk   Falls in the past year? 0 0 0 0 0  Number falls in past yr: 0 0 0 0 0  Injury with Fall? 0 0 0 0 0  Risk for fall due to :   No Fall Risks No Fall Risks   Follow up Falls evaluation completed;Education provided;Falls prevention discussed   Falls evaluation completed       Data saved with a previous flowsheet row definition    MEDICARE RISK AT HOME: Medicare Risk at Home Any stairs in or around the home?: Yes If so, are there any without handrails?: No Home free of loose throw rugs in walkways, pet beds, electrical cords, etc?: Yes Adequate lighting in your home to reduce risk of falls?: Yes Life alert?: No Use of a cane, walker or w/c?: No Grab bars in the bathroom?: No Shower chair or bench in shower?: No Elevated toilet seat or a handicapped toilet?: No  TIMED UP AND GO:  Was the test performed? No    Cognitive Function:        11/02/2023    9:49 AM  6CIT Screen  What Year? 0 points  What month? 0 points  What time? 0 points  Count back from 20 0 points  Months in reverse 0 points  Repeat phrase 0 points  Total Score 0 points    Immunizations Immunization History  Administered Date(s) Administered   Influenza,inj,Quad PF,6+ Mos 03/01/2017, 12/20/2018   Tdap 05/18/2009, 11/29/2022   Zoster Recombinant(Shingrix) 12/23/2020    TDAP status: Up to date  Flu Vaccine status:  Declined, Education has been provided regarding the importance of this vaccine but patient still declined. Advised may receive this vaccine at local pharmacy or Health Dept. Aware to provide a copy of the vaccination record if obtained from local pharmacy or Health Dept. Verbalized acceptance and understanding.  Pneumococcal vaccine status: Up to date  Covid-19 vaccine status: Declined, Education has been provided regarding the importance of this vaccine but patient still declined. Advised may receive this vaccine at local pharmacy or Health Dept.or vaccine clinic. Aware to provide a copy of the vaccination record if obtained from local pharmacy or Health Dept. Verbalized acceptance and understanding.  Qualifies for Shingles Vaccine? No   Zostavax completed Yes   Shingrix Completed?: Yes  Screening Tests Health Maintenance  Topic Date Due   Pneumococcal Vaccine: 50+ Years (1 of 2 - PCV) Never done   Zoster Vaccines- Shingrix (2 of 2) 02/17/2021   HIV Screening  11/29/2023 (Originally 12/17/1970)   INFLUENZA VACCINE  05/28/2024 (Originally 09/29/2023)   Diabetic kidney evaluation - eGFR measurement  10/25/2024   Diabetic kidney evaluation - Urine ACR  10/25/2024   Medicare Annual Wellness (AWV)  11/01/2024   Colonoscopy  06/05/2025   DTaP/Tdap/Td (3 - Td or Tdap) 11/28/2032   Hepatitis B Vaccines 19-59 Average Risk  Aged Out   HPV VACCINES  Aged Out   Meningococcal B Vaccine  Aged Out   COVID-19 Vaccine  Discontinued   Hepatitis C Screening  Discontinued    Health Maintenance  Health Maintenance Due  Topic Date Due   Pneumococcal Vaccine: 50+ Years (1 of 2 - PCV) Never done  Zoster Vaccines- Shingrix (2 of 2) 02/17/2021    Colorectal cancer screening: Type of screening: Colonoscopy. Completed 2022. Repeat every 5 years  Lung Cancer Screening: (Low Dose CT Chest recommended if Age 41-80 years, 20 pack-year currently smoking OR have quit w/in 15years.) does not qualify.   Lung  Cancer Screening Referral:   Additional Screening:  Hepatitis C Screening  never done  Vision Screening: Recommended annual ophthalmology exams for early detection of glaucoma and other disorders of the eye. Is the patient up to date with their annual eye exam?  No  Who is the provider or what is the name of the office in which the patient attends annual eye exams? [facility]     goes every 2 years If pt is not established with a provider, would they like to be referred to a provider to establish care? No .   Dental Screening: Recommended annual dental exams for proper oral hygiene    Community Resource Referral / Chronic Care Management: CRR required this visit?  No   CCM required this visit?  No    Plan:     I have personally reviewed and noted the following in the patient's chart:   Medical and social history Use of alcohol, tobacco or illicit drugs  Current medications and supplements including opioid prescriptions. Patient is not currently taking opioid prescriptions. Functional ability and status Nutritional status Physical activity Advanced directives List of other physicians Hospitalizations, surgeries, and ER visits in previous 12 months Vitals Screenings to include cognitive, depression, and falls Referrals and appointments  In addition, I have reviewed and discussed with patient certain preventive protocols, quality metrics, and best practice recommendations. A written personalized care plan for preventive services as well as general preventive health recommendations were provided to patient.     Mliss Graff, LPN   [id number]   After Visit Summary: (MyChart) Due to this being a telephonic visit, the after visit summary with patients personalized plan was offered to patient via MyChart   Nurse Notes:

## 2023-12-05 DIAGNOSIS — H524 Presbyopia: Secondary | ICD-10-CM | POA: Diagnosis not present

## 2024-01-16 DIAGNOSIS — E669 Obesity, unspecified: Secondary | ICD-10-CM | POA: Diagnosis not present

## 2024-01-16 DIAGNOSIS — Z818 Family history of other mental and behavioral disorders: Secondary | ICD-10-CM | POA: Diagnosis not present

## 2024-01-16 DIAGNOSIS — E785 Hyperlipidemia, unspecified: Secondary | ICD-10-CM | POA: Diagnosis not present

## 2024-01-16 DIAGNOSIS — K5090 Crohn's disease, unspecified, without complications: Secondary | ICD-10-CM | POA: Diagnosis not present

## 2024-01-16 DIAGNOSIS — M1990 Unspecified osteoarthritis, unspecified site: Secondary | ICD-10-CM | POA: Diagnosis not present

## 2024-01-16 DIAGNOSIS — Z833 Family history of diabetes mellitus: Secondary | ICD-10-CM | POA: Diagnosis not present

## 2024-01-16 DIAGNOSIS — Z6832 Body mass index (BMI) 32.0-32.9, adult: Secondary | ICD-10-CM | POA: Diagnosis not present

## 2024-01-16 DIAGNOSIS — Z87891 Personal history of nicotine dependence: Secondary | ICD-10-CM | POA: Diagnosis not present

## 2024-01-16 DIAGNOSIS — E119 Type 2 diabetes mellitus without complications: Secondary | ICD-10-CM | POA: Diagnosis not present

## 2024-02-19 NOTE — Progress Notes (Signed)
 Pharmacy Quality Measure Review  This patient is appearing on a report for being at risk of failing the adherence measure for cholesterol (statin) medications this calendar year.   Medication: Atorvastatin  10 mg Last fill date: 02/01/24 for 90 day supply, sold 02/06/24  Insurance report was not up to date. No action needed at this time.   Jenkins Graces, PharmD PGY1 Pharmacy Resident

## 2024-03-01 ENCOUNTER — Telehealth: Payer: Self-pay | Admitting: Gastroenterology

## 2024-03-01 NOTE — Telephone Encounter (Signed)
 Inbound call from patient stating that he is having problem with his crohn's disease and is needing to speak to the nurse in regards to what he needs to do. Patient is requesting a call back or a my chart message. Please advise.

## 2024-03-01 NOTE — Telephone Encounter (Signed)
 Called patient & scheduled him with an opening for Monday 1/5 at 8:40 am with [place], [place]. Pt advised on when/where to go, and also discussed ED precautions for over the weekend.

## 2024-03-01 NOTE — Telephone Encounter (Signed)
 See mychart message from today.

## 2024-03-01 NOTE — Telephone Encounter (Signed)
Left message for patient to call back.   Mychart message sent as well.

## 2024-03-04 ENCOUNTER — Ambulatory Visit: Payer: Self-pay | Admitting: Physician Assistant

## 2024-03-04 ENCOUNTER — Other Ambulatory Visit (INDEPENDENT_AMBULATORY_CARE_PROVIDER_SITE_OTHER)

## 2024-03-04 ENCOUNTER — Ambulatory Visit: Admitting: Physician Assistant

## 2024-03-04 ENCOUNTER — Encounter: Payer: Self-pay | Admitting: Physician Assistant

## 2024-03-04 VITALS — BP 128/86 | HR 96 | Ht 69.5 in | Wt 227.1 lb

## 2024-03-04 DIAGNOSIS — R197 Diarrhea, unspecified: Secondary | ICD-10-CM

## 2024-03-04 DIAGNOSIS — Z1159 Encounter for screening for other viral diseases: Secondary | ICD-10-CM

## 2024-03-04 DIAGNOSIS — K5000 Crohn's disease of small intestine without complications: Secondary | ICD-10-CM

## 2024-03-04 DIAGNOSIS — Z111 Encounter for screening for respiratory tuberculosis: Secondary | ICD-10-CM

## 2024-03-04 LAB — BASIC METABOLIC PANEL WITH GFR
BUN: 19 mg/dL (ref 6–23)
CO2: 27 meq/L (ref 19–32)
Calcium: 9.2 mg/dL (ref 8.4–10.5)
Chloride: 102 meq/L (ref 96–112)
Creatinine, Ser: 1.06 mg/dL (ref 0.40–1.50)
GFR: 73.29 mL/min
Glucose, Bld: 112 mg/dL — ABNORMAL HIGH (ref 70–99)
Potassium: 3.6 meq/L (ref 3.5–5.1)
Sodium: 138 meq/L (ref 135–145)

## 2024-03-04 LAB — CBC WITH DIFFERENTIAL/PLATELET
Basophils Absolute: 0 K/uL (ref 0.0–0.1)
Basophils Relative: 0.6 % (ref 0.0–3.0)
Eosinophils Absolute: 0.3 K/uL (ref 0.0–0.7)
Eosinophils Relative: 3.6 % (ref 0.0–5.0)
HCT: 47.2 % (ref 39.0–52.0)
Hemoglobin: 16 g/dL (ref 13.0–17.0)
Lymphocytes Relative: 24.3 % (ref 12.0–46.0)
Lymphs Abs: 1.8 K/uL (ref 0.7–4.0)
MCHC: 33.9 g/dL (ref 30.0–36.0)
MCV: 89.4 fl (ref 78.0–100.0)
Monocytes Absolute: 0.7 K/uL (ref 0.1–1.0)
Monocytes Relative: 9.9 % (ref 3.0–12.0)
Neutro Abs: 4.5 K/uL (ref 1.4–7.7)
Neutrophils Relative %: 61.6 % (ref 43.0–77.0)
Platelets: 225 K/uL (ref 150.0–400.0)
RBC: 5.28 Mil/uL (ref 4.22–5.81)
RDW: 13.1 % (ref 11.5–15.5)
WBC: 7.3 K/uL (ref 4.0–10.5)

## 2024-03-04 LAB — C-REACTIVE PROTEIN: CRP: <0.5 mg/dL — ABNORMAL LOW (ref 1.0–20.0)

## 2024-03-04 LAB — HEPATIC FUNCTION PANEL
ALT: 28 U/L (ref 3–53)
AST: 27 U/L (ref 5–37)
Albumin: 4.6 g/dL (ref 3.5–5.2)
Alkaline Phosphatase: 62 U/L (ref 39–117)
Bilirubin, Direct: 0.1 mg/dL (ref 0.1–0.3)
Total Bilirubin: 0.6 mg/dL (ref 0.2–1.2)
Total Protein: 7.9 g/dL (ref 6.0–8.3)

## 2024-03-04 LAB — SEDIMENTATION RATE: Sed Rate: 14 mm/h (ref 0–20)

## 2024-03-04 MED ORDER — MESALAMINE 1.2 G PO TBEC: 4.8000 g | DELAYED_RELEASE_TABLET | Freq: Every day | ORAL | 0 refills | Status: AC

## 2024-03-04 NOTE — Progress Notes (Addendum)
 "    03/04/2024 Victor Chandler [id number] 03-Apr-1955  Referring provider: Duanne Butler DASEN, MD Primary GI doctor: Dr. Suzann  ASSESSMENT AND PLAN:  Crohn's ileitis diagnosed 2022 with diarrhea, AB pressure, nausea Resolution of symptoms on budesonide  has been on Lialda  2.4 g daily with recent increase to 3 pills daily due to elevated fecal calprotectin/sed rate 3 weeks of diarrhea 4-5 x a day, nocturnal symptoms, nausea without vomiting, AB pressure/gas, no AB pain. Keeps grand kids but no definite sick contact, no ABX, weight loss, hematochezia - increase lialda  to 4.8 grams daily, prescription sent in - Get prebiologic labs in case there needs to be an escalation of therapy -Repeat sed rate, CRP and fecal calprotectin -Get infectious labs with diatherix GI stool with Cdiff, done here in the office -Suggest updating imaging with CT abdomen pelvis with contrast, consider MR enterography pending those results -Consider repeat colonoscopy to access disease activity - get prevnar 20 and influenza  I have reviewed the clinic note as outlined by Victor Coombs, PA and agree with the assessment, plan and medical decision making.  69 year old gentleman with history of nonstricturing, nonpenetrating Crohn's ileitis on Lialda  2.4 g daily presents with symptoms of diarrhea.  No improvement increasing to Lialda  3.6 g daily.  Previously had improvement in symptoms on budesonide .  Last colonoscopy in 2022 showed ulcers in terminal ileum.  I agree with ruling out infectious etiologies updated cross-sectional imaging as there are no documented studies in the computer system.  This will help assess extent and severity of disease.  Reasonable to optimize Lialda  to 4.8 g daily but if not improving may require another course of budesonide  while making decisions about starting advanced therapy.  Victor Suzann, MD    Patient Care Team: Duanne Butler DASEN, MD as PCP - General (Family Medicine)  HISTORY OF  PRESENT ILLNESS: 69 y.o. male presents for evaluation of Crohn's ileitis, maintained on Lialda  2.4 g daily Last seen in the office on 07/06/2023 by [facility] May, NP.   Current History Discussed the use of AI scribe software for clinical note transcription with the patient, who gave verbal consent to proceed.  History of Present Illness   Victor Chandler is a 69 year old male with Crohn's ileitis who presents with three weeks of increased diarrhea and changes in bowel habits.  The patient has a history of Crohn's ileitis and has been taking Lialda  2.4 grams daily. Previously took two pills daily, increasing to three during flares per prior instructions. Recently increased to three pills daily without improvement in symptoms.  For the past three weeks, beginning around Christmas, has experienced increased diarrhea with four to five bowel movements during the day and up to twice at night. In the last couple of nights, has not needed to get up at night for bowel movements. On the morning of the visit, did not have a normal bowel movement. Describes diarrhea as 'straight up diarrhea', with baseline usually being soft and mushy stools. Previously had one morning bowel movement and none for the rest of the day.  Reports sensation of incomplete evacuation after bowel movements, persistent bloating, and a feeling of pressure rather than cramping. Passes large amounts of gas, especially during diarrhea, and often feels the urge to have a bowel movement when passing gas.  Denies blood in stool, significant abdominal pain (only mild discomfort or tightness), or rectal pain. Has a hemorrhoid without new or worsening symptoms. No oral ulcers, rashes, or new joint pain. No significant  weight loss and maintains normal appetite. No recent antibiotic use.  Occasional mild nausea without significant vomiting. No fevers or chills during the day, but sometimes feels febrile upon waking. No recent sick contacts, though some  family members have had a stomach virus. No recent illness himself.  Occasional indigestion managed with Tums or a 'purple pill' as needed. No new medications except for CoQ10, which was discontinued when symptoms worsened.      Inflammatory Bowel Disease History  Last colonoscopy: 06/05/2020 colonoscopy Dr. Aneita 6 mm polyp ascending colon few erosions ileocecal valve several ulcerations in terminal ileum internal hemorrhoids pathology showed chronic active ileitis and sessile serrated polyp.  Recall 5 years Last endoscopy: Never Last Abd CT/CTE/MRE: None Extraintestinal manifestations: The patient has not had any extraintestinal symptoms Surgical history: no surgery  IBD Medication History Per notes patient states per note (07/24/20) on a colonoscopy performed elsewhere about 10 years ago he was told of spots in his colon suggesting mild Crohn's disease.  Crohn's ileitis diagnosed via colonoscopy in 2022, patient had resolution of his very mild symptoms on Budesonide  9 mg daily. 06/05/20 colonoscopy with Dr. Aneita, recall 5 years 01/17/2022 he was changed from budesonide  to Lialda .  07/06/2023 elevated sed rate and fecal calprotectin at 400s 03/01/2024 patient calling with diarrhea and abdominal pain, increased Lialda  4 pills daily without help  Review of Data  The following data was reviewed at the time of this encounter:  IBD Labs   Inflammatory markers    Latest Ref Rng & Units 05/03/2022   15:07 07/06/2023   15:38 07/10/2023   13:43  Inflammatory Markers  Calprotectin, Fecal 0 - 120 ug/g   494       Concentration     Interpretation   Follow-Up < 5 - 50 ug/g     Normal           None >50 -120 ug/g     Borderline       Re-evaluate in 4-6 weeks     >120 ug/g     Abnormal         Repeat as clinically                                    indicated   Sed Rate 0 - 20 mm/hr 23  26    CRP 0.5 - 20.0 mg/dL  <8.9    C-Reactive Protein, Cardiac 0.000 - 5.000 mg/L 1.750       Note:  An elevated  hs-CRP (>5 mg/L) should be repeated after 2 weeks to rule out recent infection or trauma.      Prebiologic Labs    Therapeutic Drug Monitoring    Laboratory Studies   Micronutrient evaluation: Pending labs, can consider B12/folate/iron for small bowel disease   Health Maintenance:    Vaccinations:  Immunization History  Administered Date(s) Administered   Influenza,inj,Quad PF,6+ Mos 03/01/2017, 12/20/2018   Tdap 05/18/2009, 11/29/2022   Zoster Recombinant(Shingrix) 12/23/2020   - Annual Flu Vaccine - get your influenza vaccine - Pneumococcal Vaccine (PCV 20) suggest getting prevnar 20 - Zoster vaccine  UTD  RELEVANT GI HISTORY, LABS, IMAGING: CBC    Component Value Date/Time   WBC 7.3 10/26/2023 0819   RBC 5.06 10/26/2023 0819   HGB 15.4 10/26/2023 0819   HCT 47.4 10/26/2023 0819   PLT 249 10/26/2023 0819   MCV 93.7 10/26/2023 0819   MCH 30.4  10/26/2023 0819   MCHC 32.5 10/26/2023 0819   RDW 12.3 10/26/2023 0819   LYMPHSABS 1.9 07/06/2023 1538   MONOABS 0.8 07/06/2023 1538   EOSABS 285 10/26/2023 0819   BASOSABS 51 10/26/2023 0819   Recent Labs    07/06/23 1538 10/26/23 0819  HGB 15.0 15.4    CMP     Component Value Date/Time   NA 138 10/26/2023 0819   K 4.3 10/26/2023 0819   CL 103 10/26/2023 0819   CO2 26 10/26/2023 0819   GLUCOSE 100 (H) 10/26/2023 0819   BUN 19 10/26/2023 0819   CREATININE 1.09 10/26/2023 0819   CALCIUM  9.5 10/26/2023 0819   PROT 7.5 10/26/2023 0819   ALBUMIN 4.7 07/06/2023 1538   AST 25 10/26/2023 0819   ALT 20 10/26/2023 0819   ALKPHOS 52 07/06/2023 1538   BILITOT 0.6 10/26/2023 0819   GFRNONAA 66 09/04/2019 0819   GFRAA 76 09/04/2019 0819      Latest Ref Rng & Units 10/26/2023    8:19 AM 07/06/2023    3:38 PM 11/25/2022    8:00 AM  Hepatic Function  Total Protein 6.1 - 8.1 g/dL 7.5  7.8  7.3   Albumin 3.5 - 5.2 g/dL  4.7    AST 10 - 35 U/L 25  27  21    ALT 9 - 46 U/L 20  18  20    Alk Phosphatase 39 - 117 U/L  52     Total Bilirubin 0.2 - 1.2 mg/dL 0.6  0.4  0.8       Current Medications:   Current Outpatient Medications (Cardiovascular):    atorvastatin  (LIPITOR) 10 MG tablet, Take 1 tablet (10 mg total) by mouth daily.  Current Outpatient Medications (Respiratory):    cetirizine (ZYRTEC) 10 MG tablet, Take 10 mg by mouth daily.   fluticasone (FLONASE) 50 MCG/ACT nasal spray, Place 2 sprays into both nostrils daily.  Current Outpatient Medications (Other):    Coenzyme Q10 (CO Q 10 PO), Take 1 capsule by mouth daily.   cholecalciferol (VITAMIN D ) 1000 units tablet, Take 1,000 Units by mouth daily.   fish oil-omega-3 fatty acids 1000 MG capsule, Take 2 g by mouth daily.   mesalamine  (LIALDA ) 1.2 g EC tablet, Take 4 tablets (4.8 g total) by mouth daily with breakfast.   Multiple Vitamin (MULTIVITAMIN) tablet, Take 1 tablet by mouth daily.   scopolamine  (TRANSDERM-SCOP) 1 MG/3DAYS, Place 1 patch (1.5 mg total) onto the skin every 3 (three) days. (Patient taking differently: Place 1 patch onto the skin every 3 (three) days. Uses for travel sickness)  Medical History:  Past Medical History:  Diagnosis Date   Allergy    Colitis    GERD (gastroesophageal reflux disease)    Hyperlipidemia    Prediabetes    Allergies: Allergies[1]   Surgical History:  He  has a past surgical history that includes Vasectomy (N/A) and Lumbar laminectomy (2019). Family History:  His family history includes Dementia in his mother.  REVIEW OF SYSTEMS  : All other systems reviewed and negative except where noted in the History of Present Illness.  PHYSICAL EXAM: BP 128/86 (BP Location: Left Arm, Patient Position: Sitting, Cuff Size: Normal)   Pulse 96   Ht 5' 9.5 (1.765 m) Comment: height measured without shoes  Wt 227 lb 2 oz (103 kg)   BMI 33.06 kg/m  Physical Exam   GENERAL APPEARANCE: Well nourished, in no apparent distress. HEENT: No cervical lymphadenopathy, unremarkable thyroid , sclerae anicteric,  conjunctiva  pink. RESPIRATORY: Respiratory effort normal, breath sounds equal bilateral without rales, rhonchi, wheezing. CARDIO: Regular rate and rhythm with no murmurs, rubs, or gallops, peripheral pulses intact. ABDOMEN: Soft, non-distended, active bowel sounds in all 4 quadrants, no tenderness to palpation, no rebound, no mass appreciated. Abdomen tight with gurgling, no pain. RECTAL: No external hemorrhoids observed. No masses or significant stool in rectum. MUSCULOSKELETAL: Full range of motion, normal gait, without edema. SKIN: Dry, intact without rashes or lesions. No jaundice. NEURO: Alert, oriented, no focal deficits. PSYCH: Cooperative, normal mood and affect.        Victor JONELLE Coombs, PA-C 9:13 AM       [1]  Allergies Allergen Reactions   Codeine Nausea And Vomiting   Sulfa Antibiotics Rash   "

## 2024-03-04 NOTE — Patient Instructions (Addendum)
 Your provider has requested that you go to the basement level for lab work before leaving today. Press B on the elevator. The lab is located at the first door on the left as you exit the elevator.  You will be contacted by [facility] Scheduling in the next 2 days to arrange a CT Abdomen/Pelvis.  The number on your caller ID will be [phone], please answer when they call.  If you have not heard from them in 2 days please call [phone] to schedule.     Increase lialda  to 4 x a day  VISIT SUMMARY:  You visited us  today due to increased diarrhea and changes in your bowel habits over the past three weeks. We discussed your history of Crohn's disease and made adjustments to your medication and ordered tests to better understand your current condition.  YOUR PLAN:  CROHN'S DISEASE OF THE SMALL INTESTINE: You are experiencing a mild to moderate flare of your chronic ileal Crohn's disease, and we are concerned about possible complications like strictures or fistulas. -We performed a rectal examination and obtained a stool sample to check for infections. -A CT scan of your abdomen and pelvis has been ordered to look for strictures or fistulas. -We ordered blood tests to check inflammatory markers and assess disease activity. -Increase your Lialda  dosage to four tablets daily. -Monitor for complications and follow the guidance provided for imaging.  SCREENING FOR HEPATITIS B AND TUBERCULOSIS: We need to screen for hepatitis B and tuberculosis due to the potential need for stronger immunosuppressive therapy for your Crohn's disease. -We ordered a hepatitis B serology test. -We ordered a blood test to screen for tuberculosis.  Due to recent changes in healthcare laws, you may see the results of your imaging and laboratory studies on MyChart before your provider has had a chance to review them.  We understand that in some cases there may be results that are confusing or concerning to  you. Not all laboratory results come back in the same time frame and the provider may be waiting for multiple results in order to interpret others.  Please give us  48 hours in order for your provider to thoroughly review all the results before contacting the office for clarification of your results.    I appreciate the  opportunity to care for you  Thank You   [facility]

## 2024-03-05 LAB — HEPATITIS B SURFACE ANTIGEN: Hepatitis B Surface Ag: NONREACTIVE

## 2024-03-06 LAB — QUANTIFERON-TB GOLD PLUS
Mitogen-NIL: 8.31 [IU]/mL
NIL: 0.07 [IU]/mL
QuantiFERON-TB Gold Plus: NEGATIVE
TB1-NIL: 0.02 [IU]/mL
TB2-NIL: 0.02 [IU]/mL

## 2024-03-07 NOTE — Telephone Encounter (Signed)
 SABRA

## 2024-03-14 ENCOUNTER — Ambulatory Visit (HOSPITAL_COMMUNITY)
Admission: RE | Admit: 2024-03-14 | Discharge: 2024-03-14 | Disposition: A | Discharge status: Home / Self Care | Source: Ambulatory Visit | Attending: Physician Assistant | Admitting: Physician Assistant

## 2024-03-14 DIAGNOSIS — K5000 Crohn's disease of small intestine without complications: Secondary | ICD-10-CM | POA: Insufficient documentation

## 2024-03-14 DIAGNOSIS — R197 Diarrhea, unspecified: Secondary | ICD-10-CM | POA: Diagnosis present

## 2024-03-14 MED ORDER — IOHEXOL 300 MG/ML  SOLN
100.0000 mL | Freq: Once | INTRAMUSCULAR | Status: AC | PRN
  Administered 2024-03-14: 100 mL via INTRAVENOUS

## 2024-03-20 ENCOUNTER — Encounter: Payer: Self-pay | Admitting: Physician Assistant

## 2024-04-03 ENCOUNTER — Other Ambulatory Visit: Payer: Self-pay | Admitting: Family Medicine

## 2024-04-03 NOTE — Telephone Encounter (Signed)
 Copied from CRM [id number]. Topic: Clinical - Medication Refill >> Apr 03, 2024 11:23 AM Emylou G wrote: Medication: scopolamine  (TRANSDERM-SCOP) 1 MG/3DAYS  Has the patient contacted their pharmacy? No (Agent: If no, request that the patient contact the pharmacy for the refill. If patient does not wish to contact the pharmacy document the reason why and proceed with request.) (Agent: If yes, when and what did the pharmacy advise?)  This is the patient's preferred pharmacy:  [facility] [phone] - [place], [place] - [address] AT [facility] OF [address] [place] [address] Phone: [phone] Fax: [phone]  Is this the correct pharmacy for this prescription? Yes If no, delete pharmacy and type the correct one.   Has the prescription been filled recently? unsure  Is the patient out of the medication? Yes  Has the patient been seen for an appointment in the last year OR does the patient have an upcoming appointment? Yes  Can we respond through MyChart? Yes  Agent: Please be advised that Rx refills may take up to 3 business days. We ask that you follow-up with your pharmacy.

## 2024-04-05 MED ORDER — SCOPOLAMINE 1 MG/3DAYS TD PT72: 1.0000 | MEDICATED_PATCH | TRANSDERMAL | 12 refills | Status: AC

## 2024-04-05 NOTE — Telephone Encounter (Signed)
 Requested medications are due for refill today.  yes  Requested medications are on the active medications list.  yes  Last refill. 11/25/2021 #[id number]  Future visit scheduled.   yes  Notes to clinic.  Medication not assigned to a protocol. Please review for refill.     Requested Prescriptions  Pending Prescriptions Disp Refills   scopolamine  (TRANSDERM-SCOP) 1 MG/3DAYS 10 patch 12    Sig: Place 1 patch (1 mg total) onto the skin every 3 (three) days.     Off-Protocol Failed - 04/05/2024 10:17 AM      Failed - Medication not assigned to a protocol, review manually.      Passed - Valid encounter within last 12 months    Recent Outpatient Visits           1 year ago Rotator cuff tendonitis, left   [place] [facility] Pickard, Butler DASEN, MD   1 year ago Need for diphtheria-tetanus-pertussis (Tdap) vaccine   [place] [facility] Duanne Butler DASEN, MD   2 years ago Controlled type 2 diabetes mellitus without complication, without long-term current use of insulin [facility])   [place] [facility] Duanne Butler DASEN, MD   2 years ago Bronchitis   [place] [facility] Kayla Jeoffrey RAMAN, FNP   2 years ago Hypersomnolence   [place] [facility] Pickard, Butler DASEN, MD

## 2024-04-05 NOTE — Telephone Encounter (Signed)
 Pt. States that he is Leaving on a flight to [facility] 04/25/2024 -05/03/2024 to visit his son. If approved he would like for the medication ( scopolamine  1mg /3 days) refilled for the travel. If approved please send to [employer] on [address]  Thank you.

## 2024-11-07 ENCOUNTER — Encounter
